# Patient Record
Sex: Female | Born: 1965 | Race: White | Hispanic: No | Marital: Married | Smoking: Former smoker
Health system: Southern US, Community
[De-identification: ages and names within clinical notes are randomized; demographics above are authoritative.]

## PROBLEM LIST (undated history)

## (undated) DIAGNOSIS — R51 Headache: Secondary | ICD-10-CM

## (undated) DIAGNOSIS — J45909 Unspecified asthma, uncomplicated: Secondary | ICD-10-CM

## (undated) DIAGNOSIS — D219 Benign neoplasm of connective and other soft tissue, unspecified: Secondary | ICD-10-CM

## (undated) DIAGNOSIS — T7840XA Allergy, unspecified, initial encounter: Secondary | ICD-10-CM

## (undated) HISTORY — PX: UTERINE FIBROID SURGERY: SHX826

## (undated) HISTORY — DX: Headache: R51

## (undated) HISTORY — DX: Unspecified asthma, uncomplicated: J45.909

## (undated) HISTORY — DX: Allergy, unspecified, initial encounter: T78.40XA

---

## 2002-08-15 ENCOUNTER — Encounter: Admission: RE | Admit: 2002-08-15 | Discharge: 2002-08-15 | Payer: Self-pay | Admitting: Internal Medicine

## 2002-08-15 ENCOUNTER — Encounter: Payer: Self-pay | Admitting: Internal Medicine

## 2011-09-04 ENCOUNTER — Telehealth: Payer: Self-pay

## 2011-09-04 MED ORDER — GUAIFENESIN ER 1200 MG PO TB12: 1.0000 | ORAL_TABLET | Freq: Two times a day (BID) | ORAL | Status: DC | PRN

## 2011-09-04 MED ORDER — IPRATROPIUM BROMIDE 0.03 % NA SOLN: 2.0000 | Freq: Two times a day (BID) | NASAL | Status: DC

## 2011-09-04 MED ORDER — AMOXICILLIN 875 MG PO TABS: 1750.0000 mg | ORAL_TABLET | Freq: Two times a day (BID) | ORAL | Status: DC

## 2011-09-04 NOTE — Telephone Encounter (Signed)
Spoke with pt, she is having pain above her sinuses and eyes since yesterday evening. No cough, no drainage, no dysuria, no diarrhea. Pt states she is coming back on Monday and cannot see how she is going to fly with this pain. She can breathe fine but it is so painful. She would like whatever she had last time she was here for sinus infection, it worked well. The pt was very nice on the phone and understanding.

## 2011-09-04 NOTE — Telephone Encounter (Signed)
Already addressed

## 2011-09-04 NOTE — Telephone Encounter (Signed)
I have reviewed her paper chart.  The last treatment for a sinusitis was in 2009.  Erx'd meds to the [facility] in [place], [place]  Please verify they received them.

## 2011-09-04 NOTE — Telephone Encounter (Signed)
Please call patient and get info/details.  We have not seen the patient in at least 6 months, and we do not call in abx for infections we have not evaluated.  Patient's husband has called and been rude to [place].

## 2011-09-04 NOTE — Telephone Encounter (Signed)
MARTIN STATES HE AND Taormina ARE IN [place] AND SHE HAVE A SINUS INFECTION. WOULD LIKE [place] TO CALL IN A  Z-PAK FOR HER AND THEY WILL BE IN SOMETIME NEXT WEEK FOR A VISIT PLEASE CALL [phone]. [facility] IN [place] AND THE FAX IS [phone], HE DIDN'T KNOW THE PHONE NUMBER   [facility] AT [phone]

## 2011-09-09 ENCOUNTER — Ambulatory Visit (INDEPENDENT_AMBULATORY_CARE_PROVIDER_SITE_OTHER): Payer: PRIVATE HEALTH INSURANCE | Admitting: Family Medicine

## 2011-09-09 VITALS — BP 114/67 | HR 60 | Temp 97.4°F | Resp 16 | Ht 64.0 in | Wt 142.0 lb

## 2011-09-09 DIAGNOSIS — J329 Chronic sinusitis, unspecified: Secondary | ICD-10-CM

## 2011-09-09 DIAGNOSIS — G501 Atypical facial pain: Secondary | ICD-10-CM

## 2011-09-09 MED ORDER — AMOXICILLIN 875 MG PO TABS: 875.0000 mg | ORAL_TABLET | Freq: Two times a day (BID) | ORAL | Status: AC

## 2011-09-09 NOTE — Progress Notes (Signed)
was in [place] and called 09/04/11 and we called in Amox for sinus infection. Was told to follow-up once back and still has slight HA, sinus pressure and pain, ands nasal congestion.  Pain is now much milder and improving, but patient has run out of her medication  O:  NAD HEENT:  Unremarkable Neck:  Supple, no adenopathy Fundi:  Normal Neuro:  Normal mental status, cranial nerves, motor  A:  Sinusitis, improving  1. Sinusitis  amoxicillin (AMOXIL) 875 MG tablet

## 2011-11-11 ENCOUNTER — Ambulatory Visit (INDEPENDENT_AMBULATORY_CARE_PROVIDER_SITE_OTHER): Payer: PRIVATE HEALTH INSURANCE | Admitting: Emergency Medicine

## 2011-11-11 VITALS — BP 112/72 | HR 60 | Temp 98.6°F | Resp 16 | Ht 65.0 in | Wt 146.0 lb

## 2011-11-11 DIAGNOSIS — H539 Unspecified visual disturbance: Secondary | ICD-10-CM

## 2011-11-11 DIAGNOSIS — R51 Headache: Secondary | ICD-10-CM | POA: Insufficient documentation

## 2011-11-11 DIAGNOSIS — G43909 Migraine, unspecified, not intractable, without status migrainosus: Secondary | ICD-10-CM

## 2011-11-11 DIAGNOSIS — R519 Headache, unspecified: Secondary | ICD-10-CM | POA: Insufficient documentation

## 2011-11-11 DIAGNOSIS — J309 Allergic rhinitis, unspecified: Secondary | ICD-10-CM | POA: Insufficient documentation

## 2011-11-11 DIAGNOSIS — J45909 Unspecified asthma, uncomplicated: Secondary | ICD-10-CM | POA: Insufficient documentation

## 2011-11-11 DIAGNOSIS — R42 Dizziness and giddiness: Secondary | ICD-10-CM

## 2011-11-11 LAB — POCT URINALYSIS DIPSTICK
Glucose, UA: NEGATIVE
Spec Grav, UA: 1.01
Urobilinogen, UA: 0.2

## 2011-11-11 LAB — POCT CBC
Granulocyte percent: 58 %G (ref 37–80)
HCT, POC: 46.1 % (ref 37.7–47.9)
Lymph, poc: 2.9 (ref 0.6–3.4)
MCHC: 31.2 g/dL — AB (ref 31.8–35.4)
MID (cbc): 0.5 (ref 0–0.9)
MPV: 10.1 fL (ref 0–99.8)
POC Granulocyte: 4.8 (ref 2–6.9)
POC LYMPH PERCENT: 35.5 %L (ref 10–50)
POC MID %: 6.5 %M (ref 0–12)
Platelet Count, POC: 345 10*3/uL (ref 142–424)
RDW, POC: 12 %

## 2011-11-11 LAB — POCT UA - MICROSCOPIC ONLY: Mucus, UA: NEGATIVE

## 2011-11-11 LAB — GLUCOSE, POCT (MANUAL RESULT ENTRY): POC Glucose: 80 mg/dl (ref 70–99)

## 2011-11-11 NOTE — Progress Notes (Signed)
Subjective:    Patient ID: Erica Richards, female    DOB: [birth year], 46 y.o.   MRN: [id number]  HPI This 46 y.o. female presents for evaluation of visual disturbance.  Symptoms began in July, when she returned from [place]  Reports episodes of seeing blue spots, unable to see anything else, for several moments (feels like minutes) about 3 times each week.  Has to close her eyes and wait for it to pass.  Most noticeable when looking at her computer screen or TV, but can happen any time.  She also notes intense dizziness prior to her trip to [place]  No nausea, no spinning "visual" but has a spinning sensation; feels like a "weight."  Not related to movement, and typically occurs when she is sitting or lying still.  Has had some vertiginous symptoms with rapid position changes, but completely different and separate from this dizziness.  Thinks her vision is worse than before, but has longstanding poor vision with astigmatism.  Her last visit with her eye specialist suggested she may need to add reading glasses to her corrective contact lenses (06/2011).  Mother had a detached retina. No change in hearing. She does have a history of Migraine HA, but they are very infrequent.  Her current symptoms are NOT associated with HA.   Review of Systems    Past Medical History  Diagnosis Date  . Asthma   . Allergy   . Headache     History reviewed. No pertinent past surgical history.  Prior to Admission medications   Medication Sig Start Date End Date Taking? Authorizing Provider  montelukast (SINGULAIR) 10 MG tablet Take 10 mg by mouth at bedtime.   Yes Historical Provider, MD  norethindrone-ethinyl estradiol 1/35 (ORTHO-NOVUM, NORTREL,CYCLAFEM) tablet Take 1 tablet by mouth daily.   Yes Historical Provider, MD    Allergies  Allergen Reactions  . Sulfa Antibiotics Rash    History   Social History  . Marital Status: Married    Spouse Name: Daphine Deutscher    Number of Children: 2  .  Years of Education: 16   Occupational History  . 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 Smoking status: Former Smoker    Types: Cigarettes    Quit date: 11/11/1994  . Smokeless tobacco: Never Used  . Alcohol Use: No  . Drug Use: No  . Sexually Active: Yes -- Female partner(s)    Birth Control/ Protection: Pill   Other Topics Concern  . Not on file   Social History Narrative   Lives with her husband. Older daughter is a full-time Consulting civil engineer at [employer], scheduled to graduate in 01/2012. Younger daughter lives with them part-time, and part-time with her father.    History reviewed. No pertinent family history.     Objective:   Physical Exam Blood pressure 112/72, pulse 60, temperature 98.6 F (37 C), temperature source Oral, resp. rate 16, height 5\' 5"  (1.651 m), weight 146 lb (66.225 kg), last menstrual period 11/11/2011, SpO2 100.00%. Body mass index is 24.30 kg/(m^2). Well-developed, well nourished WF who is awake, alert and oriented, in NAD. HEENT: [place]/AT, PERRL, EOMI.  Sclera and conjunctiva are clear.  Fundi are normal appearing. EAC are patent, TMs are normal in appearance. Nasal mucosa is pink and moist. OP is clear. Neck: supple, non-tender, no lymphadenopathy, thyromegaly. Heart: RRR, no murmur Lungs: normal effort, CTA Abdomen: normo-active bowel sounds, supple, non-tender, no mass or organomegaly. Extremities:  no cyanosis, clubbing or edema. Skin: warm and dry without rash. Psychologic: good mood and appropriate affect, normal speech and behavior. Neurologic:  CNII-XII are intact.  No nystagmus, though lateral gaze to the right initially caused vertigo, but not on repeat testing. Normal sensation and strength.  Normal DTRs.  Cerebellar function intact.   Results for orders placed in visit on 11/11/11  POCT CBC      Component Value Range   WBC 8.3  4.6 - 10.2 K/uL   Lymph, poc 2.9  0.6 - 3.4   POC LYMPH PERCENT 35.5  10  - 50 %L   MID (cbc) 0.5  0 - 0.9   POC MID % 6.5  0 - 12 %M   POC Granulocyte 4.8  2 - 6.9   Granulocyte percent 58.0  37 - 80 %G   RBC 4.56  4.04 - 5.48 M/uL   Hemoglobin 14.4  12.2 - 16.2 g/dL   HCT, POC 11.9  14.7 - 47.9 %   MCV 101.0 (*) 80 - 97 fL   MCH, POC 31.6 (*) 27 - 31.2 pg   MCHC 31.2 (*) 31.8 - 35.4 g/dL   RDW, POC 82.9     Platelet Count, POC 345  142 - 424 K/uL   MPV 10.1  0 - 99.8 fL  GLUCOSE, POCT (MANUAL RESULT ENTRY)      Component Value Range   POC Glucose 80  70 - 99 mg/dl  POCT URINE PREGNANCY      Component Value Range   Preg Test, Ur Negative    POCT UA - MICROSCOPIC ONLY      Component Value Range   WBC, Ur, HPF, POC 0-1     RBC, urine, microscopic 11-18  Currently menstruating.   Bacteria, U Microscopic 2+     Mucus, UA negative     Epithelial cells, urine per micros 1-7     Crystals, Ur, HPF, POC negative     Casts, Ur, LPF, POC negative     Yeast, UA negative    POCT URINALYSIS DIPSTICK      Component Value Range   Color, UA yellow     Clarity, UA slightly cloudy     Glucose, UA negative     Bilirubin, UA negative     Ketones, UA negative     Spec Grav, UA 1.010     Blood, UA large     pH, UA 5.5     Protein, UA negative     Urobilinogen, UA 0.2     Nitrite, UA negative     Leukocytes, UA Negative         Assessment & Plan:   1. Dizziness  POCT CBC, POCT glucose (manual entry), POCT urine pregnancy, POCT UA - Microscopic Only, POCT urinalysis dipstick, EKG 12-Lead, Comprehensive metabolic panel, TSH, MR Brain W Wo Contrast  2. AR (allergic rhinitis)    3. Asthma    4. Visual disturbance  MR Brain W Wo Contrast  5. Migraine  MR Brain W Wo Contrast   Declines Antivert, stating that the symptoms resolve spontaneously quickly enough that she doesn't think medication would benefit her.  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 Discussed with Dr. Cleta Alberts.

## 2011-11-11 NOTE — Patient Instructions (Signed)
Schedule with your eye specialist soon.  If everything is normal, including on the MRI, we'll refer you to neurologist if your symptoms persist.

## 2011-11-12 LAB — COMPREHENSIVE METABOLIC PANEL
Albumin: 4.4 g/dL (ref 3.5–5.2)
Alkaline Phosphatase: 44 U/L (ref 39–117)
BUN: 13 mg/dL (ref 6–23)
Calcium: 9.8 mg/dL (ref 8.4–10.5)
Chloride: 103 mEq/L (ref 96–112)
Glucose, Bld: 81 mg/dL (ref 70–99)
Potassium: 4.4 mEq/L (ref 3.5–5.3)

## 2011-11-13 ENCOUNTER — Encounter: Payer: Self-pay | Admitting: Physician Assistant

## 2011-11-18 ENCOUNTER — Ambulatory Visit
Admission: RE | Admit: 2011-11-18 | Discharge: 2011-11-18 | Disposition: A | Discharge status: Home / Self Care | Payer: No Typology Code available for payment source | Source: Ambulatory Visit | Attending: Physician Assistant | Admitting: Physician Assistant

## 2011-11-18 ENCOUNTER — Other Ambulatory Visit: Payer: Self-pay | Admitting: Physician Assistant

## 2011-11-18 DIAGNOSIS — H539 Unspecified visual disturbance: Secondary | ICD-10-CM

## 2011-11-18 DIAGNOSIS — G43909 Migraine, unspecified, not intractable, without status migrainosus: Secondary | ICD-10-CM

## 2011-11-18 DIAGNOSIS — R42 Dizziness and giddiness: Secondary | ICD-10-CM

## 2011-11-18 DIAGNOSIS — G935 Compression of brain: Secondary | ICD-10-CM

## 2011-11-18 MED ORDER — GADOBENATE DIMEGLUMINE 529 MG/ML IV SOLN
13.0000 mL | Freq: Once | INTRAVENOUS | Status: AC | PRN
  Administered 2011-11-18: 13 mL via INTRAVENOUS

## 2011-11-30 ENCOUNTER — Telehealth: Payer: Self-pay

## 2011-11-30 NOTE — Telephone Encounter (Signed)
I have called patient, she has seen a doctor at [facility]. She indicates he wants to do other testing, she does not know what they have recommended, she will call them and have them send notes to my attention.

## 2011-11-30 NOTE — Telephone Encounter (Signed)
chelle  Patient would like to talk to you regarding the referral that she had please call [phone]

## 2011-12-04 ENCOUNTER — Ambulatory Visit (INDEPENDENT_AMBULATORY_CARE_PROVIDER_SITE_OTHER): Payer: PRIVATE HEALTH INSURANCE | Admitting: Family Medicine

## 2011-12-04 VITALS — BP 114/64 | HR 75 | Temp 98.4°F | Resp 16 | Ht 64.5 in | Wt 146.0 lb

## 2011-12-04 DIAGNOSIS — H539 Unspecified visual disturbance: Secondary | ICD-10-CM

## 2011-12-04 DIAGNOSIS — R51 Headache: Secondary | ICD-10-CM

## 2011-12-04 DIAGNOSIS — G935 Compression of brain: Secondary | ICD-10-CM | POA: Insufficient documentation

## 2011-12-04 DIAGNOSIS — R42 Dizziness and giddiness: Secondary | ICD-10-CM

## 2011-12-04 DIAGNOSIS — L659 Nonscarring hair loss, unspecified: Secondary | ICD-10-CM

## 2011-12-04 LAB — PROLACTIN: Prolactin: 6.1 ng/mL

## 2011-12-04 NOTE — Progress Notes (Signed)
Subjective:    Patient ID: Erica Richards, female    DOB: 1965-11-15, 46 y.o.   MRN: [id number]  HPI This 46 y.o. female presents for evaluation of headache, visual disturbance and debilitating vertigo/dizziness.  She has had no HA or vertigo, but has had the visual disturbance (flashing lights) since her last visit here 11/11/2011.  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  She was evaluated by Dr. Wynetta Emery at [facility] and his note from 11/24/2011 is reviewed.  He does  Not think that her symptoms are related to the Chiari malformation, and advised her to 1) see her PCP for hormone testing and 2) see Dr. Vela Prose for evaluation of the HA.  At her last visit here, we checked a TSH, which was normal.  Since she is on a combined oral contraceptive, FSH and LH aren't going to be helpful in this situation.  Today she notes that she had her last HA/vertigo episode just before the onset of her menstrual bleeding.  Since she anticipates her period next week, she wonders if the symptoms will recur.  She also has noticed significant thinning of her hair, which is very alarming to her.  No increased fatigue, skin changes, constipation, hot flashes.  Review of Systems As above.   Past Medical History  Diagnosis Date  . Asthma   . Allergy   . Headache     History reviewed. No pertinent past surgical history.  Prior to Admission medications   Medication Sig Start Date End Date Taking? Authorizing Provider  montelukast (SINGULAIR) 10 MG tablet Take 10 mg by mouth at bedtime.   Yes Historical Provider, MD  norethindrone-ethinyl estradiol 1/35 (ORTHO-NOVUM, NORTREL,CYCLAFEM) tablet Take 1 tablet by mouth daily.   Yes Historical Provider, MD    Allergies  Allergen Reactions  . Sulfa Antibiotics Rash    History   Social History  . Marital Status: Married    Spouse Name: Daphine Deutscher    Number of  Children: 2  . Years of Education: 16   Occupational History  . 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 Smoking status: Former Smoker    Types: Cigarettes    Quit date: 11/11/1994  . Smokeless tobacco: Never Used  . Alcohol Use: No  . Drug Use: No  . Sexually Active: Yes -- Female partner(s)    Birth Control/ Protection: Pill   Other Topics Concern  . Not on file   Social History Narrative   Lives with her husband. Older daughter is a full-time Consulting civil engineer at [employer], scheduled to graduate in 01/2012. Younger daughter lives with them part-time, and part-time with her father.    History reviewed. No pertinent family history.     Objective:   Physical Exam Blood pressure 114/64, pulse 75, temperature 98.4 F (36.9 C), temperature source Oral, resp. rate 16, height 5' 4.5" (1.638 m), weight 146 lb (66.225 kg), last menstrual period 11/11/2011, SpO2 99.00%. Body mass index is 24.67 kg/(m^2). Well-developed, well nourished WF who is awake, alert and oriented, in NAD. HEENT: [place]/AT, sclera and conjunctiva are clear.  Neck: supple, non-tender, no lymphadenopathy, thyromegaly. Heart: RRR, no murmur Lungs: normal effort, CTA Psychologic: good mood and appropriate affect, normal speech and behavior.     Assessment & Plan:   1. Dizziness  Prolactin, Ambulatory referral to Neurology  2. Visual disturbance  Prolactin, Ambulatory referral to Neurology  3. HA (headache)  Prolactin, Ambulatory referral to Neurology  4. Hair loss  Prolactin   Proceed with neurology evaluation.  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  I suspect her thinning hair is temporary, but am happy to refer to endocrinology if it persists.  If necessary, she could stop her COC temporarily so FSH and LH can be checked, but she will need another very effective contraceptive in it's place.  Discussed with Dr. Katrinka Blazing.

## 2012-01-20 NOTE — Progress Notes (Signed)
HPI and physical exam reviewed in detail. Discussed patient in detail with Porfirio Oar, PA-C.  Agree with current A/P.  Proceed with neurological evaluation first; if evaluation unrevealing, consider referral to endocrinology.  KMS

## 2012-01-25 NOTE — Progress Notes (Signed)
Reviewed and agree.

## 2012-07-11 ENCOUNTER — Ambulatory Visit (INDEPENDENT_AMBULATORY_CARE_PROVIDER_SITE_OTHER): Payer: PRIVATE HEALTH INSURANCE | Admitting: Emergency Medicine

## 2012-07-11 VITALS — BP 122/78 | HR 86 | Temp 98.0°F | Resp 16 | Ht 64.5 in | Wt 143.0 lb

## 2012-07-11 DIAGNOSIS — K6289 Other specified diseases of anus and rectum: Secondary | ICD-10-CM

## 2012-07-11 DIAGNOSIS — K645 Perianal venous thrombosis: Secondary | ICD-10-CM

## 2012-07-11 MED ORDER — DOCUSATE SODIUM 100 MG PO CAPS: 100.0000 mg | ORAL_CAPSULE | Freq: Two times a day (BID) | ORAL | Status: DC

## 2012-07-11 MED ORDER — HYDROCODONE-ACETAMINOPHEN 5-325 MG PO TABS: 1.0000 | ORAL_TABLET | ORAL | Status: DC | PRN

## 2012-07-11 NOTE — Progress Notes (Signed)
Procedure Note: Verbal consent obtained from the patient.  Local anesthesia with 2cc 1% lidocaine with epinephrine.  Betadine prep.  Elliptical incision with 15 blade.  Clot expressed from the hemorrhoid.  Hemostasis easily obtained with direct pressure.  Dressing applied.  Pt tolerated very well.

## 2012-07-11 NOTE — Progress Notes (Signed)
[facility] [address] [place] [address] [phone]  Date:  07/11/2012   Name:  Erica Richards   DOB:  1965/10/29   MRN:  [id number]  PCP:  No primary provider on file.    Chief Complaint: Hemorrhoids   History of Present Illness:  Erica Richards is a 47 y.o. very pleasant female patient who presents with the following:  History of prior thrombosed external hemorrhoids.  Now has left thrombosed hemorrhoid for past few days.  No constipation or bleeding.  Denies other complaint or health concern today.   Patient Active Problem List   Diagnosis Date Noted  . Dizziness 12/04/2011  . Visual disturbance 12/04/2011  . Chiari malformation type I 12/04/2011  . AR (allergic rhinitis) 11/11/2011  . Asthma 11/11/2011  . Headache     Past Medical History  Diagnosis Date  . Asthma   . Allergy   . Headache(784.0)     History reviewed. No pertinent past surgical history.  History  Substance Use Topics  . Smoking status: Former Smoker    Types: Cigarettes    Quit date: 11/11/1994  . Smokeless tobacco: Never Used  . Alcohol Use: No    History reviewed. No pertinent family history.  Allergies  Allergen Reactions  . Sulfa Antibiotics Rash    Medication list has been reviewed and updated.  Current Outpatient Prescriptions on File Prior to Visit  Medication Sig Dispense Refill  . montelukast (SINGULAIR) 10 MG tablet Take 10 mg by mouth at bedtime.      . norethindrone-ethinyl estradiol 1/35 (ORTHO-NOVUM, NORTREL,CYCLAFEM) tablet Take 1 tablet by mouth daily.       No current facility-administered medications on file prior to visit.    Review of Systems:  As per HPI, otherwise negative.    Physical Examination: Filed Vitals:   07/11/12 1442  BP: 122/78  Pulse: 86  Temp: 98 F (36.7 C)  Resp: 16   Filed Vitals:   07/11/12 1442  Height: 5' 4.5" (1.638 m)  Weight: 143 lb (64.864 kg)   Body mass index is 24.18 kg/(m^2). Ideal Body  Weight: Weight in (lb) to have BMI = 25: 147.6   GEN: WDWN, NAD, Non-toxic, Alert & Oriented x 3 HEENT: Atraumatic, Normocephalic.  Ears and Nose: No external deformity. EXTR: No clubbing/cyanosis/edema NEURO: Normal gait.  PSYCH: Normally interactive. Conversant. Not depressed or anxious appearing.  Calm demeanor.  RECTAL:  Thrombosed external hemorrhoid.  Assessment and Plan: Thrombosed external hemorrhoid Colace vicodin Sitz Follow up in one week   Signed,  Phillips Odor, MD

## 2012-07-11 NOTE — Patient Instructions (Addendum)
Hemorrhoids Hemorrhoids are swollen veins around the rectum or anus. There are two types of hemorrhoids:   Internal hemorrhoids. These occur in the veins just inside the rectum. They may poke through to the outside and become irritated and painful.  External hemorrhoids. These occur in the veins outside the anus and can be felt as a painful swelling or hard lump near the anus. CAUSES  Pregnancy.   Obesity.   Constipation or diarrhea.   Straining to have a bowel movement.   Sitting for long periods on the toilet.  Heavy lifting or other activity that caused you to strain.  Anal intercourse. SYMPTOMS   Pain.   Anal itching or irritation.   Rectal bleeding.   Fecal leakage.   Anal swelling.   One or more lumps around the anus.  DIAGNOSIS  Your caregiver may be able to diagnose hemorrhoids by visual examination. Other examinations or tests that may be performed include:   Examination of the rectal area with a gloved hand (digital rectal exam).   Examination of anal canal using a small tube (scope).   A blood test if you have lost a significant amount of blood.  A test to look inside the colon (sigmoidoscopy or colonoscopy). TREATMENT Most hemorrhoids can be treated at home. However, if symptoms do not seem to be getting better or if you have a lot of rectal bleeding, your caregiver may perform a procedure to help make the hemorrhoids get smaller or remove them completely. Possible treatments include:   Placing a rubber band at the base of the hemorrhoid to cut off the circulation (rubber band ligation).   Injecting a chemical to shrink the hemorrhoid (sclerotherapy).   Using a tool to burn the hemorrhoid (infrared light therapy).   Surgically removing the hemorrhoid (hemorrhoidectomy).   Stapling the hemorrhoid to block blood flow to the tissue (hemorrhoid stapling).  HOME CARE INSTRUCTIONS   Eat foods with fiber, such as whole grains, beans,  nuts, fruits, and vegetables. Ask your doctor about taking products with added fiber in them (fibersupplements).  Increase fluid intake. Drink enough water and fluids to keep your urine clear or pale yellow.   Exercise regularly.   Go to the bathroom when you have the urge to have a bowel movement. Do not wait.   Avoid straining to have bowel movements.   Keep the anal area dry and clean. Use wet toilet paper or moist towelettes after a bowel movement.   Medicated creams and suppositories may be used or applied as directed.   Only take over-the-counter or prescription medicines as directed by your caregiver.   Take warm sitz baths for 15 20 minutes, 3 4 times a day to ease pain and discomfort.   Place ice packs on the hemorrhoids if they are tender and swollen. Using ice packs between sitz baths may be helpful.   Put ice in a plastic bag.   Place a towel between your skin and the bag.   Leave the ice on for 15 20 minutes, 3 4 times a day.   Do not use a donut-shaped pillow or sit on the toilet for long periods. This increases blood pooling and pain.  SEEK MEDICAL CARE IF:  You have increasing pain and swelling that is not controlled by treatment or medicine.  You have uncontrolled bleeding.  You have difficulty or you are unable to have a bowel movement.  You have pain or inflammation outside the area of the hemorrhoids. MAKE SURE YOU:    Understand these instructions.  Will watch your condition.  Will get help right away if you are not doing well or get worse. Document Released: 01/24/2000 Document Revised: 01/13/2012 Document Reviewed: 12/01/2011 Exit[employer] Patient Information 2014 [employer].  

## 2012-09-01 ENCOUNTER — Telehealth: Payer: Self-pay

## 2012-09-01 NOTE — Telephone Encounter (Signed)
Erica Richards or first available: Patient calling about her hemorrhoids. Please call her asap.  [phone]

## 2012-09-01 NOTE — Telephone Encounter (Signed)
Rtn pt call- She has been experiencing hemorrhoids. She states it is "tortured" she states it has been there for 3 weeks in total. She is worried that she needs to have surgery. Advised her to come back in. She states that she was here early July and had one drained. She states this is in a different spot. She wants to continue OTC remedies and if it gets worse by tomorrow she will come in.

## 2012-09-02 ENCOUNTER — Ambulatory Visit (INDEPENDENT_AMBULATORY_CARE_PROVIDER_SITE_OTHER): Payer: PRIVATE HEALTH INSURANCE | Admitting: Physician Assistant

## 2012-09-02 VITALS — BP 108/70 | HR 62 | Temp 98.2°F | Resp 16

## 2012-09-02 DIAGNOSIS — B001 Herpesviral vesicular dermatitis: Secondary | ICD-10-CM | POA: Insufficient documentation

## 2012-09-02 DIAGNOSIS — B009 Herpesviral infection, unspecified: Secondary | ICD-10-CM

## 2012-09-02 DIAGNOSIS — K645 Perianal venous thrombosis: Secondary | ICD-10-CM

## 2012-09-02 MED ORDER — HYDROCORTISONE ACETATE 25 MG RE SUPP: 25.0000 mg | Freq: Two times a day (BID) | RECTAL | Status: DC

## 2012-09-02 MED ORDER — VALACYCLOVIR HCL 1 G PO TABS: ORAL_TABLET | ORAL | Status: DC

## 2012-09-02 NOTE — Progress Notes (Signed)
   [address] [place] [address]   Phone [phone]  Subjective:    Patient ID: Erica Richards, female    DOB: [birth year], 47 y.o.   MRN: [id number]  HPI  Pt presents to clinic with a thrombosed hemorrhoid.  She had one in June but it was in a different location and it completed resolved after the procedure.  She has chronic problems with constipation but does not feel like she strains.  She has used steroid creams before which have helped.  She also has fever blisters and has been having more of them recently.  She has used Zovirax which helped and would like a Rx if possible.  Review of Systems     Objective:   Physical Exam  Vitals reviewed. Constitutional: She is oriented to person, place, and time. She appears well-developed and well-nourished.  Eyes: Conjunctivae are normal.  Neck: Normal range of motion.  Genitourinary: Rectal exam shows external hemorrhoid (thrombosed on the L side).  Neurological: She is alert and oriented to person, place, and time.  Skin: Skin is warm and dry.  Psychiatric: She has a normal mood and affect. Her behavior is normal. Judgment and thought content normal.   Procedure:  Consent obtained.  Local anesthesia with 2% lido with epi.  Area cleaned and iris scissors used to make an elliptical incision in the thrombosed hemorrhoid - multiple clots were removed.  Wound care d/w pt.    Assessment & Plan:  Thrombosed external hemorrhoid - Plan: hydrocortisone (ANUSOL-HC) 25 MG suppository to be used when she gets a hemorrhoid to decrease chances of thrombosed hemorrhoids in the future.  Herpes labialis- valACYclovir (VALTREX) 1000 MG tablet  Benny Lennert [facility] 09/02/2012 8:48 PM

## 2013-03-23 ENCOUNTER — Other Ambulatory Visit: Payer: Self-pay | Admitting: Physician Assistant

## 2013-06-30 ENCOUNTER — Ambulatory Visit (INDEPENDENT_AMBULATORY_CARE_PROVIDER_SITE_OTHER): Payer: PRIVATE HEALTH INSURANCE | Admitting: Family Medicine

## 2013-06-30 VITALS — BP 112/70 | HR 58 | Temp 98.1°F | Resp 16 | Ht 64.0 in | Wt 148.6 lb

## 2013-06-30 DIAGNOSIS — H8110 Benign paroxysmal vertigo, unspecified ear: Secondary | ICD-10-CM

## 2013-06-30 MED ORDER — MECLIZINE HCL 12.5 MG PO TABS: 12.5000 mg | ORAL_TABLET | Freq: Three times a day (TID) | ORAL | Status: DC | PRN

## 2013-06-30 NOTE — Progress Notes (Signed)
°  This chart was scribed for Erica Hamman, MD by Eston Mould, ED Scribe. This patient was seen in room Room/bed 13 and the patient's care was started at 4:54 PM. Subjective:    Patient ID: Erica Richards, female    DOB: Jul 12, 1965, 48 y.o.   MRN: [id number] Chief Complaint  Patient presents with   Otalgia    x 1 week ago   HPI Erica Richards is a 48 y.o. female who presents to the [facility] complaining of ongoing otalgia and dizziness that began 1 week ago. Pt states she felt dizzy 3 days ago and last night, turning to the R precipitates the dizziness. States her dizziness begins when she is lying down and subsides when she is upright. Denies recent fevers.  Work: Psychologist, forensic  Review of Systems  Constitutional: Negative for fever.  HENT: Positive for ear pain. Negative for ear discharge and facial swelling.   All other systems reviewed and are negative.  Objective:   Physical Exam  Nursing note and vitals reviewed. Constitutional: She is oriented to person, place, and time. She appears well-developed and well-nourished. No distress.  When lying supine and turning head to the R, patient experiences sudden violent dizziness.  HENT:  Head: Normocephalic and atraumatic.  Right Ear: External ear normal.  Left Ear: External ear normal.  Eyes: EOM are normal.  Neck: Neck supple. No tracheal deviation present.  Cardiovascular: Normal rate.   Pulmonary/Chest: Effort normal. No respiratory distress.  Musculoskeletal: Normal range of motion.  Neurological: She is alert and oriented to person, place, and time.  Skin: Skin is warm and dry.  Psychiatric: She has a normal mood and affect. Her behavior is normal.   Triage Vitals:BP 112/70   Pulse 58   Temp(Src) 98.1 F (36.7 C) (Oral)   Resp 16   Ht 5\' 4"  (1.626 m)   Wt 148 lb 9.6 oz (67.405 kg)   BMI 25.49 kg/m2   SpO2 100% Assessment & Plan:  Will discharge pt with medication. Benign paroxysmal positional  vertigo - Plan: meclizine (ANTIVERT) 12.5 MG tablet  Signed, Robyn Haber, MD

## 2013-06-30 NOTE — Patient Instructions (Signed)

## 2013-10-03 ENCOUNTER — Ambulatory Visit (INDEPENDENT_AMBULATORY_CARE_PROVIDER_SITE_OTHER): Payer: PRIVATE HEALTH INSURANCE | Admitting: Physician Assistant

## 2013-10-03 VITALS — BP 118/68 | HR 75 | Temp 97.5°F | Resp 18 | Ht 65.0 in | Wt 149.0 lb

## 2013-10-03 DIAGNOSIS — J0110 Acute frontal sinusitis, unspecified: Secondary | ICD-10-CM

## 2013-10-03 DIAGNOSIS — J029 Acute pharyngitis, unspecified: Secondary | ICD-10-CM

## 2013-10-03 DIAGNOSIS — J3489 Other specified disorders of nose and nasal sinuses: Secondary | ICD-10-CM

## 2013-10-03 DIAGNOSIS — R0981 Nasal congestion: Secondary | ICD-10-CM

## 2013-10-03 MED ORDER — AMOXICILLIN-POT CLAVULANATE 875-125 MG PO TABS: 1.0000 | ORAL_TABLET | Freq: Two times a day (BID) | ORAL | Status: DC

## 2013-10-03 NOTE — Progress Notes (Signed)
   Subjective:    Patient ID: Erica Richards, female    DOB: 09-09-1965, 48 y.o.   MRN: [id number]  HPI 48 year old female presents for evaluation of 1 week history of sinus congestion, PND, sinus headache, PND, and sore throat. Admits she noticed some wheezing and SOB yesterday, but denies chest pain, dizziness, hemoptysis, or cough. She has been taking OTC tylenol and sudafed which does seem to help some, but symptoms always return. Had 3 days of a sinus headache that has now resolved, although facial pain continues. Hx of sinus infections in the past, usually 2x/year.  Patient is otherwise doing well with no other concerns today.      Review of Systems  Constitutional: Negative for fever and chills.  HENT: Positive for congestion, postnasal drip, sinus pressure and sore throat. Negative for ear pain.   Respiratory: Positive for shortness of breath and wheezing. Negative for cough.   Gastrointestinal: Negative for nausea and vomiting.  Neurological: Positive for headaches. Negative for dizziness.       Objective:   Physical Exam  Constitutional: She is oriented to person, place, and time. She appears well-developed and well-nourished.  HENT:  Head: Normocephalic and atraumatic.  Right Ear: Hearing, tympanic membrane, external ear and ear canal normal.  Left Ear: Hearing, tympanic membrane, external ear and ear canal normal.  Nose: Right sinus exhibits frontal sinus tenderness. Right sinus exhibits no maxillary sinus tenderness. Left sinus exhibits frontal sinus tenderness. Left sinus exhibits no maxillary sinus tenderness.  Mouth/Throat: Uvula is midline, oropharynx is clear and moist and mucous membranes are normal.  Eyes: Conjunctivae are normal.  Neck: Normal range of motion.  Cardiovascular: Normal rate.   Pulmonary/Chest: Effort normal.  Lymphadenopathy:    She has no cervical adenopathy.  Neurological: She is alert and oriented to person, place, and time.  Psychiatric: She  has a normal mood and affect. Her behavior is normal. Judgment and thought content normal.          Assessment & Plan:  Acute frontal sinusitis, recurrence not specified - Plan: amoxicillin-clavulanate (AUGMENTIN) 875-125 MG per tablet, DISCONTINUED: amoxicillin-clavulanate (AUGMENTIN) 875-125 MG per tablet  Acute pharyngitis, unspecified pharyngitis type  Nasal congestion  Patient afebrile and lungs are clear. Will treat as acute frontal sinusitis with Augmentin 875 mg bid x 7 days Continue Sudafed as directed if helpful. Patient declined nasal spray due to causing migraine headaches.  RTC precautions discussed - worsening SOB, wheezing, fever, chills, chest pain.  Follow up if symptoms worsen or fail to improve.

## 2013-10-04 ENCOUNTER — Telehealth: Payer: Self-pay

## 2013-10-04 DIAGNOSIS — J0110 Acute frontal sinusitis, unspecified: Secondary | ICD-10-CM

## 2013-10-04 MED ORDER — CEFDINIR 300 MG PO CAPS
600.0000 mg | ORAL_CAPSULE | Freq: Every day | ORAL | Status: AC
Start: 2013-10-04 — End: 2013-10-14

## 2013-10-04 NOTE — Telephone Encounter (Signed)
Rtn call to pt- she has horrible nausea and diarrhea. Pt has stopped taking medication and would like something else called in for her. I have added this to her allergy list as an intolerance. Please advise.

## 2013-10-04 NOTE — Telephone Encounter (Signed)
Pt was seen by Nira Conn yesterday and was prescribed an antibiotic.  She says she needs something else called in because the antibiotic has upset her stomach so bad.  [phone]

## 2013-10-04 NOTE — Telephone Encounter (Signed)
Pt advised.

## 2013-10-04 NOTE — Telephone Encounter (Signed)
Meds ordered this encounter  Medications  . cefdinir (OMNICEF) 300 MG capsule    Sig: Take 2 capsules (600 mg total) by mouth daily.    Dispense:  20 capsule    Refill:  0    Order Specific Question:  Supervising Provider    Answer:  DOOLITTLE, ROBERT P [[id number]]

## 2013-12-28 ENCOUNTER — Ambulatory Visit (INDEPENDENT_AMBULATORY_CARE_PROVIDER_SITE_OTHER): Payer: PRIVATE HEALTH INSURANCE | Admitting: Physician Assistant

## 2013-12-28 VITALS — BP 122/78 | HR 62 | Temp 98.1°F | Resp 18 | Ht 65.0 in | Wt 153.0 lb

## 2013-12-28 DIAGNOSIS — Z889 Allergy status to unspecified drugs, medicaments and biological substances status: Secondary | ICD-10-CM

## 2013-12-28 DIAGNOSIS — Z9109 Other allergy status, other than to drugs and biological substances: Secondary | ICD-10-CM

## 2013-12-28 DIAGNOSIS — J0140 Acute pansinusitis, unspecified: Secondary | ICD-10-CM

## 2013-12-28 DIAGNOSIS — J3489 Other specified disorders of nose and nasal sinuses: Secondary | ICD-10-CM

## 2013-12-28 MED ORDER — AZELASTINE HCL 0.1 % NA SOLN: 2.0000 | Freq: Every day | NASAL | Status: DC

## 2013-12-28 MED ORDER — AMOXICILLIN 875 MG PO TABS: 875.0000 mg | ORAL_TABLET | Freq: Two times a day (BID) | ORAL | Status: DC

## 2013-12-28 MED ORDER — AZELASTINE HCL 0.1 % NA SOLN: 2.0000 | Freq: Two times a day (BID) | NASAL | Status: DC

## 2013-12-28 MED ORDER — CETIRIZINE HCL 10 MG PO TABS: 10.0000 mg | ORAL_TABLET | Freq: Every day | ORAL | Status: DC

## 2013-12-28 NOTE — Progress Notes (Signed)
Subjective:    Patient ID: Erica Richards, female    DOB: 21-Dec-1965, 48 y.o.   MRN: [id number]  Chief Complaint  Patient presents with  . Headache    2 weeks on and off worse the past 3 days     HPI  48 year old female with PMH of asthma, allergy, and headache, is here for sinus pressure and pain that has been intermittent for 2 weeks, but progressively worsened over the last 3 days.  The pain is behind the eyes and feels as if her "eyes are being pushed out".  Her cheeks and teeth hurt.  She has taken chlorotrimeton, aleve, and sudafed for relief, which has helped very little.  She denies sorethroat, dyspnea, photophobia, or ear symptoms.  She denies a fever, but yesterday felt as if she was burning up.  She lives with daughter and husband with 3 dogs and 1 cat, though she has strong allergies.  She takes singulair for her allergies as needed, which she states resolves her allergy symptoms.  She has the cat for her daughter, but states that she does not want it.  The dogs sleep in the bed with her, and the cat lays on her bed, near her pillows.   She states that claritin does not work and zyrtec helped very little.  When asking about antibiotics, she claims that the augmentin felt like pins and needles going down her throat.   She states that most nasal sprays give her migraines including flonase.  She may use the nedipot when she remembers during her nasal congestion/sinus pressure episodes.      Allergies: Feathers, dust, dander, dogs, trees, cats  Past Medical History  Diagnosis Date  . Asthma   . Allergy   . Headache(784.0)    History   Social History  . Marital Status: Married    Spouse Name: Hassell Done    Number of Children: 2  . Years of Education: 16   Occupational History  . 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 Smoking status: Former Smoker    Types: Cigarettes    Quit date: 11/11/1994  . Smokeless tobacco: Never Used  . Alcohol Use: No    . Drug Use: No  . Sexual Activity:    Partners: Male    Birth Control/ Protection: Pill   Other Topics Concern  . None   Social History Narrative   Lives with her husband. Older daughter is a full-time Ship broker at [employer], scheduled to graduate in 01/2012. Younger daughter lives with them part-time, and part-time with her father.   Fam Hx: Daughter has allergies.     Review of Systems  HENT: Positive for congestion. Negative for ear pain, postnasal drip, sinus pressure and sore throat.   Eyes: Positive for pain. Negative for photophobia.  Respiratory: Negative for cough.   Neurological: Positive for headaches.       Objective:   Physical Exam  Constitutional: She is oriented to person, place, and time. Vital signs are normal. She appears well-developed and well-nourished. No distress.  BP 122/78 mmHg  Pulse 62  Temp(Src) 98.1 F (36.7 C) (Oral)  Resp 18  Ht 5\' 5"  (1.651 m)  Wt 153 lb (69.4 kg)  BMI 25.46 kg/m2  SpO2 100%  LMP 12/27/2013   HENT:  Head: Normocephalic and atraumatic.  Nose: Mucosal edema (cyanotic nasal mucosa) present. No rhinorrhea. Right sinus exhibits no maxillary sinus tenderness (No  tenderness with all sinus cavities upon palpation, however felt general soothing with palpative pressure.) and no frontal sinus tenderness. Left sinus exhibits no maxillary sinus tenderness and no frontal sinus tenderness.  Mouth/Throat: Mucous membranes are not cyanotic. No oropharyngeal exudate, posterior oropharyngeal edema or posterior oropharyngeal erythema.  Eyes: Pupils are equal, round, and reactive to light. Right conjunctiva is injected (Mildly injected bilaterally). Right conjunctiva has no hemorrhage. Left conjunctiva is injected. Left conjunctiva has no hemorrhage.  Neck: Normal range of motion. Neck supple. No tracheal tenderness present. No thyroid mass and no thyromegaly present.  Lymphadenopathy:       Head (right side): No submental, no  submandibular, no tonsillar and no occipital adenopathy present.       Head (left side): No submental, no submandibular, no tonsillar and no occipital adenopathy present.    She has no cervical adenopathy.  Neurological: She is alert and oriented to person, place, and time.  Skin: Skin is warm and dry.  Psychiatric: She has a normal mood and affect. Her behavior is normal. Judgment and thought content normal.  Nursing note and vitals reviewed.         Assessment & Plan:  48 year old female is here today with increased sinus pressure and pain worsened 3 days ago.  This appears to be a sinus infection of bacterial etiology, but the true source I'm more suspicious is the lack of sinus allergy maintenance.  Lengthy discussion of avoiding triggers, and not having pets on bed, and on taking a daily medication for her allergies.    Sinus pressure - Plan: cetirizine (ZYRTEC) 10 MG tablet, azelastine (ASTELIN) 0.1 % nasal spray, DISCONTINUED: azelastine (ASTELIN) 0.1 % nasal spray  Multiple allergies - Plan: cetirizine (ZYRTEC) 10 MG tablet, azelastine (ASTELIN) 0.1 % nasal spray, DISCONTINUED: azelastine (ASTELIN) 0.1 % nasal spray  Subacute pansinusitis - Plan: amoxicillin (AMOXIL) 875 MG tablet  Ivar Drape, PA-C [facility] 11/19/20158:25 PM

## 2013-12-28 NOTE — Patient Instructions (Signed)

## 2014-05-24 ENCOUNTER — Other Ambulatory Visit: Payer: Self-pay | Admitting: Obstetrics & Gynecology

## 2014-05-24 DIAGNOSIS — D259 Leiomyoma of uterus, unspecified: Secondary | ICD-10-CM

## 2014-05-29 ENCOUNTER — Ambulatory Visit
Admission: RE | Admit: 2014-05-29 | Discharge: 2014-05-29 | Disposition: A | Discharge status: Home / Self Care | Payer: No Typology Code available for payment source | Source: Ambulatory Visit | Attending: Obstetrics & Gynecology | Admitting: Obstetrics & Gynecology

## 2014-05-29 DIAGNOSIS — D259 Leiomyoma of uterus, unspecified: Secondary | ICD-10-CM

## 2014-05-29 HISTORY — DX: Benign neoplasm of connective and other soft tissue, unspecified: D21.9

## 2014-05-29 NOTE — Consult Note (Signed)
Chief Complaint: Chief Complaint  Patient presents with  . Advice Only    Consult for [place]      Referring Physician(s): Morris,Megan  History of Present Illness: Erica Richards is a 48 y.o. female who was diagnosed with uterine fibroids in February of this year as part of her workup for UTI. In retrospect, she notes that she has had increasingly heavy periods over the past 3 years, with her periods lasting 4 days with 4 days of heavy bleeding, no clots, requiring changing pads every 6-8 hours, as well as some interperiod bleeding. She also describes some pelvic/abdominal bloating symptoms, urinary frequency, and pelvic pressure. Besides birth control pills, she's had no previous fibroid therapies were surgery. Currently gynecologic infections been yeast infection. Pap smear 06/27/2010 was negative. Ultrasound 05/18/2014 demonstrated multiple fibroids up to 4 cm. No plans for future pregnancy. Her mother did not enter menopause until after 8.  Past Medical History  Diagnosis Date  . Asthma   . Allergy   . Headache(784.0)   . Fibroids     No past surgical history on file.  Allergies: Augmentin and Sulfa antibiotics  Medications: Prior to Admission medications   Medication Sig Start Date End Date Taking? Authorizing Provider  cetirizine (ZYRTEC) 10 MG tablet Take 1 tablet (10 mg total) by mouth daily. 12/28/13  Yes Stephanie D English, PA  montelukast (SINGULAIR) 10 MG tablet Take 10 mg by mouth at bedtime.   Yes Historical Provider, MD  norethindrone-ethinyl estradiol 1/35 (ORTHO-NOVUM, NORTREL,CYCLAFEM) tablet Take 1 tablet by mouth daily.   Yes Historical Provider, MD  valACYclovir (VALTREX) 1000 MG tablet TAKE 2 TABLETS AT OUTBREAK ONSET THEN REPEAT IN 12 HOURS FOR EACH OUTBREAK.   Yes Mancel Bale, PA-C  amoxicillin (AMOXIL) 875 MG tablet Take 1 tablet (875 mg total) by mouth 2 (two) times daily. Patient not taking: Reported on 05/29/2014 12/28/13   Dorian Heckle English, PA    azelastine (ASTELIN) 0.1 % nasal spray Place 2 sprays into both nostrils 2 (two) times daily. Use in each nostril as directed Patient not taking: Reported on 05/29/2014 12/28/13   Dorian Heckle English, PA  meclizine (ANTIVERT) 12.5 MG tablet Take 1 tablet (12.5 mg total) by mouth 3 (three) times daily as needed for dizziness. Patient not taking: Reported on 05/29/2014 06/30/13   Robyn Haber, MD    No family history on file.  History   Social History  . Marital Status: Married    Spouse Name: Leelah Hanna Done  . Number of Children: 2  . Years of Education: 16   Occupational History  . 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 Smoking status: Former Smoker    Types: Cigarettes    Quit date: 11/11/1994  . Smokeless tobacco: Never Used  . Alcohol Use: No  . Drug Use: No  . Sexual Activity:    Partners: Male    Birth Control/ Protection: Pill   Other Topics Concern  . Not on file   Social History Narrative   Lives with her husband. Older daughter is a full-time Ship broker at [employer], scheduled to graduate in 01/2012. Younger daughter lives with them part-time, and part-time with her father.    ECOG Status: 1 - Symptomatic but completely ambulatory  Review of Systems: A 12 point ROS discussed and pertinent positives are indicated in the HPI above.  All other systems are negative.  Review of Systems  All other systems  reviewed and are negative.   Vital Signs: BP 109/60 mmHg  Pulse 58  Temp(Src) 98.8 F (37.1 C) (Oral)  Resp 14  Ht 5\' 4"  (1.626 m)  Wt 150 lb (68.04 kg)  BMI 25.73 kg/m2  SpO2 99%  LMP 04/29/2014 (Exact Date)  Physical Exam  Constitutional: She is oriented to person, place, and time. She appears well-developed and well-nourished. No distress.  HENT:  Head: Normocephalic.  Eyes: EOM are normal. Right eye exhibits no discharge. Left eye exhibits no discharge. No scleral icterus.  Pulmonary/Chest: Effort normal. No  stridor. No respiratory distress.  Abdominal: She exhibits no distension.  Musculoskeletal: Normal range of motion.  Neurological: She is alert and oriented to person, place, and time. Coordination normal.  Skin: Skin is warm and dry. She is not diaphoretic.  Psychiatric: She has a normal mood and affect. Her behavior is normal. Judgment and thought content normal.    Imaging: No results found.  Labs:  CBC: No results for input(s): WBC, HGB, HCT, PLT in the last 8760 hours.  COAGS: No results for input(s): INR, APTT in the last 8760 hours.  BMP: No results for input(s): NA, K, CL, CO2, GLUCOSE, BUN, CALCIUM, CREATININE, GFRNONAA, GFRAA in the last 8760 hours.  Invalid input(s): CMP  LIVER FUNCTION TESTS: No results for input(s): BILITOT, AST, ALT, ALKPHOS, PROT, ALBUMIN in the last 8760 hours.  TUMOR MARKERS: No results for input(s): AFPTM, CEA, CA199, CHROMGRNA in the last 8760 hours.  Assessment and Plan:  My impression is that this pleasant patient has symptomatic uterine fibroids. We discussed the pathophysiology of uterine leiomyomata. We discussed treatment options including watchful waiting until menopause, hysterectomy, and uterine fibroid embolization. We discussed UFE technique, anticipated benefits, time course of symptom resolution, possible risks and complications, and need for continued gynecologic care. She seemed well educated and well-informed and did ask appropriate questions. She seemed motivated to proceed and wished to discuss her choice with family members. I gave her literature to review. In the event that she elects to proceed with UFE, we'll need to set up an outpatient pelvic MR with contrast to assess the exact location of all of her fibroids, specifically to exclude any pedunculated fibroids on a narrow stalk, and to exclude any unexpected pelvic pathology. Assuming this is okay, we can set up outpatient UFE with extended overnight stay for pain control, at  her convenience.  Thank you for this interesting consult.  I greatly enjoyed meeting Erica Richards and look forward to participating in their care. I'll keep you updated with her progress.  Signed: Chiniqua Kilcrease III, DAYNE Rigley Niess 05/29/2014, 9:46 AM   I spent a total of 30 minutes face to face in clinical consultation, greater than 50% of which was counseling/coordinating care for symptomatic uterine fibroids.

## 2014-06-01 ENCOUNTER — Other Ambulatory Visit: Payer: Self-pay | Admitting: Obstetrics & Gynecology

## 2014-06-01 DIAGNOSIS — D259 Leiomyoma of uterus, unspecified: Secondary | ICD-10-CM

## 2014-06-12 ENCOUNTER — Ambulatory Visit
Admission: RE | Admit: 2014-06-12 | Discharge: 2014-06-12 | Disposition: A | Discharge status: Home / Self Care | Payer: PRIVATE HEALTH INSURANCE | Source: Ambulatory Visit | Attending: Obstetrics & Gynecology | Admitting: Obstetrics & Gynecology

## 2014-06-12 DIAGNOSIS — D259 Leiomyoma of uterus, unspecified: Secondary | ICD-10-CM

## 2014-06-12 MED ORDER — GADOBENATE DIMEGLUMINE 529 MG/ML IV SOLN
13.0000 mL | Freq: Once | INTRAVENOUS | Status: AC | PRN
Start: 2014-06-12 — End: 2014-06-12
  Administered 2014-06-12: 13 mL via INTRAVENOUS

## 2014-06-15 ENCOUNTER — Other Ambulatory Visit: Payer: Self-pay | Admitting: Interventional Radiology

## 2014-06-15 DIAGNOSIS — D251 Intramural leiomyoma of uterus: Secondary | ICD-10-CM

## 2014-07-06 ENCOUNTER — Other Ambulatory Visit: Payer: Self-pay | Admitting: Radiology

## 2014-07-10 ENCOUNTER — Ambulatory Visit (HOSPITAL_COMMUNITY)
Admission: RE | Admit: 2014-07-10 | Discharge: 2014-07-11 | Disposition: A | Discharge status: Home / Self Care | Payer: PRIVATE HEALTH INSURANCE | Source: Ambulatory Visit | Attending: Interventional Radiology | Admitting: Interventional Radiology

## 2014-07-10 ENCOUNTER — Encounter (HOSPITAL_COMMUNITY): Payer: Self-pay

## 2014-07-10 ENCOUNTER — Ambulatory Visit (HOSPITAL_COMMUNITY)
Admission: RE | Admit: 2014-07-10 | Discharge: 2014-07-10 | Disposition: A | Discharge status: Home / Self Care | Payer: PRIVATE HEALTH INSURANCE | Source: Ambulatory Visit | Attending: Interventional Radiology | Admitting: Interventional Radiology

## 2014-07-10 DIAGNOSIS — J45909 Unspecified asthma, uncomplicated: Secondary | ICD-10-CM | POA: Diagnosis not present

## 2014-07-10 DIAGNOSIS — D251 Intramural leiomyoma of uterus: Secondary | ICD-10-CM | POA: Diagnosis present

## 2014-07-10 DIAGNOSIS — Z87891 Personal history of nicotine dependence: Secondary | ICD-10-CM | POA: Insufficient documentation

## 2014-07-10 DIAGNOSIS — D219 Benign neoplasm of connective and other soft tissue, unspecified: Secondary | ICD-10-CM

## 2014-07-10 DIAGNOSIS — Z882 Allergy status to sulfonamides status: Secondary | ICD-10-CM | POA: Insufficient documentation

## 2014-07-10 LAB — CBC WITH DIFFERENTIAL/PLATELET
BASOS PCT: 0 % (ref 0–1)
Basophils Absolute: 0 10*3/uL (ref 0.0–0.1)
Eosinophils Absolute: 0.4 10*3/uL (ref 0.0–0.7)
Eosinophils Relative: 4 % (ref 0–5)
HCT: 38 % (ref 36.0–46.0)
HEMOGLOBIN: 12.4 g/dL (ref 12.0–15.0)
Lymphocytes Relative: 21 % (ref 12–46)
Lymphs Abs: 2 10*3/uL (ref 0.7–4.0)
MCH: 31 pg (ref 26.0–34.0)
MCHC: 32.6 g/dL (ref 30.0–36.0)
MCV: 95 fL (ref 78.0–100.0)
MONO ABS: 0.7 10*3/uL (ref 0.1–1.0)
MONOS PCT: 7 % (ref 3–12)
Neutro Abs: 6.5 10*3/uL (ref 1.7–7.7)
Neutrophils Relative %: 68 % (ref 43–77)
Platelets: 254 10*3/uL (ref 150–400)
RBC: 4 MIL/uL (ref 3.87–5.11)
RDW: 13.1 % (ref 11.5–15.5)
WBC: 9.5 10*3/uL (ref 4.0–10.5)

## 2014-07-10 LAB — BASIC METABOLIC PANEL
ANION GAP: 9 (ref 5–15)
BUN: 16 mg/dL (ref 6–20)
CHLORIDE: 107 mmol/L (ref 101–111)
CO2: 22 mmol/L (ref 22–32)
Calcium: 8.8 mg/dL — ABNORMAL LOW (ref 8.9–10.3)
Creatinine, Ser: 0.93 mg/dL (ref 0.44–1.00)
Glucose, Bld: 96 mg/dL (ref 65–99)
POTASSIUM: 3.6 mmol/L (ref 3.5–5.1)
Sodium: 138 mmol/L (ref 135–145)

## 2014-07-10 LAB — PROTIME-INR
INR: 1 (ref 0.00–1.49)
Prothrombin Time: 13.4 seconds (ref 11.6–15.2)

## 2014-07-10 LAB — HCG, SERUM, QUALITATIVE: PREG SERUM: NEGATIVE

## 2014-07-10 MED ORDER — MONTELUKAST SODIUM 10 MG PO TABS
10.0000 mg | ORAL_TABLET | Freq: Every day | ORAL | Status: DC
  Administered 2014-07-10: 10 mg via ORAL
  Filled 2014-07-10 (×2): qty 1

## 2014-07-10 MED ORDER — SODIUM CHLORIDE 0.9 % IJ SOLN: 9.0000 mL | INTRAMUSCULAR | Status: DC | PRN

## 2014-07-10 MED ORDER — CEFAZOLIN SODIUM-DEXTROSE 2-3 GM-% IV SOLR
2.0000 g | Freq: Once | INTRAVENOUS | Status: AC
  Administered 2014-07-10: 2 g via INTRAVENOUS

## 2014-07-10 MED ORDER — MIDAZOLAM HCL 2 MG/2ML IJ SOLN
INTRAMUSCULAR | Status: AC | PRN
  Administered 2014-07-10: 0.5 mg via INTRAVENOUS
  Administered 2014-07-10: 1 mg via INTRAVENOUS

## 2014-07-10 MED ORDER — MIDAZOLAM HCL 2 MG/2ML IJ SOLN
INTRAMUSCULAR | Status: AC
  Filled 2014-07-10: qty 6

## 2014-07-10 MED ORDER — NORETHINDRONE-ETH ESTRADIOL 1-35 MG-MCG PO TABS
1.0000 | ORAL_TABLET | Freq: Every day | ORAL | Status: DC
  Administered 2014-07-10 – 2014-07-11 (×2): 1 via ORAL

## 2014-07-10 MED ORDER — PROMETHAZINE HCL 25 MG PO TABS: 25.0000 mg | ORAL_TABLET | Freq: Three times a day (TID) | ORAL | Status: DC | PRN

## 2014-07-10 MED ORDER — HYDROMORPHONE 0.3 MG/ML IV SOLN
INTRAVENOUS | Status: AC
  Administered 2014-07-10: 11:00:00
  Filled 2014-07-10: qty 25

## 2014-07-10 MED ORDER — CEFAZOLIN SODIUM-DEXTROSE 2-3 GM-% IV SOLR
INTRAVENOUS | Status: AC
  Filled 2014-07-10: qty 50

## 2014-07-10 MED ORDER — SODIUM CHLORIDE 0.9 % IJ SOLN: 3.0000 mL | Freq: Two times a day (BID) | INTRAMUSCULAR | Status: DC

## 2014-07-10 MED ORDER — ATROPINE SULFATE 0.1 MG/ML IJ SOLN
INTRAMUSCULAR | Status: AC
  Administered 2014-07-10: 0.5 mg
  Filled 2014-07-10: qty 10

## 2014-07-10 MED ORDER — SODIUM CHLORIDE 0.9 % IV SOLN: 250.0000 mL | INTRAVENOUS | Status: DC | PRN

## 2014-07-10 MED ORDER — KETOROLAC TROMETHAMINE 30 MG/ML IJ SOLN
INTRAMUSCULAR | Status: AC
  Filled 2014-07-10: qty 1

## 2014-07-10 MED ORDER — IOHEXOL 300 MG/ML  SOLN
100.0000 mL | Freq: Once | INTRAMUSCULAR | Status: AC | PRN
  Administered 2014-07-10: 55 mL via INTRA_ARTERIAL

## 2014-07-10 MED ORDER — HYDROMORPHONE HCL 2 MG/ML IJ SOLN
INTRAMUSCULAR | Status: AC
  Filled 2014-07-10: qty 1

## 2014-07-10 MED ORDER — SODIUM CHLORIDE 0.9 % IV SOLN
250.0000 mL | INTRAVENOUS | Status: DC | PRN
Start: 2014-07-10 — End: 2014-07-10

## 2014-07-10 MED ORDER — KETOROLAC TROMETHAMINE 30 MG/ML IJ SOLN
30.0000 mg | Freq: Four times a day (QID) | INTRAMUSCULAR | Status: DC
  Administered 2014-07-10 – 2014-07-11 (×4): 30 mg via INTRAVENOUS
  Filled 2014-07-10 (×7): qty 1

## 2014-07-10 MED ORDER — DOCUSATE SODIUM 100 MG PO CAPS: 100.0000 mg | ORAL_CAPSULE | Freq: Two times a day (BID) | ORAL | Status: DC

## 2014-07-10 MED ORDER — ONDANSETRON HCL 4 MG/2ML IJ SOLN
4.0000 mg | Freq: Four times a day (QID) | INTRAMUSCULAR | Status: DC | PRN
  Administered 2014-07-10: 4 mg via INTRAVENOUS
  Filled 2014-07-10: qty 2

## 2014-07-10 MED ORDER — ONDANSETRON HCL 4 MG/2ML IJ SOLN: 4.0000 mg | Freq: Four times a day (QID) | INTRAMUSCULAR | Status: DC | PRN

## 2014-07-10 MED ORDER — FENTANYL CITRATE (PF) 100 MCG/2ML IJ SOLN
INTRAMUSCULAR | Status: AC
  Filled 2014-07-10: qty 4

## 2014-07-10 MED ORDER — LIDOCAINE HCL 1 % IJ SOLN
INTRAMUSCULAR | Status: AC
  Filled 2014-07-10: qty 20

## 2014-07-10 MED ORDER — FENTANYL CITRATE (PF) 100 MCG/2ML IJ SOLN
INTRAMUSCULAR | Status: AC | PRN
  Administered 2014-07-10: 25 ug via INTRAVENOUS
  Administered 2014-07-10: 50 ug via INTRAVENOUS

## 2014-07-10 MED ORDER — DIPHENHYDRAMINE HCL 50 MG/ML IJ SOLN: 12.5000 mg | Freq: Four times a day (QID) | INTRAMUSCULAR | Status: DC | PRN

## 2014-07-10 MED ORDER — SODIUM CHLORIDE 0.9 % IJ SOLN: 3.0000 mL | INTRAMUSCULAR | Status: DC | PRN

## 2014-07-10 MED ORDER — SODIUM CHLORIDE 0.9 % IV SOLN
INTRAVENOUS | Status: DC
  Administered 2014-07-10: 08:00:00 via INTRAVENOUS

## 2014-07-10 MED ORDER — PROMETHAZINE HCL 25 MG RE SUPP: 25.0000 mg | Freq: Three times a day (TID) | RECTAL | Status: DC | PRN

## 2014-07-10 MED ORDER — SODIUM CHLORIDE 0.9 % IV SOLN
INTRAVENOUS | Status: DC
  Administered 2014-07-10 – 2014-07-11 (×2): via INTRAVENOUS

## 2014-07-10 MED ORDER — CETYLPYRIDINIUM CHLORIDE 0.05 % MT LIQD
7.0000 mL | Freq: Two times a day (BID) | OROMUCOSAL | Status: DC
  Administered 2014-07-10 – 2014-07-11 (×2): 7 mL via OROMUCOSAL

## 2014-07-10 MED ORDER — KETOROLAC TROMETHAMINE 30 MG/ML IJ SOLN
30.0000 mg | Freq: Once | INTRAMUSCULAR | Status: AC
  Administered 2014-07-10: 30 mg via INTRAVENOUS

## 2014-07-10 MED ORDER — HYDROMORPHONE 0.3 MG/ML IV SOLN
INTRAVENOUS | Status: DC
  Administered 2014-07-10: 1.5 mg via INTRAVENOUS
  Administered 2014-07-10: 3.6 mg via INTRAVENOUS
  Administered 2014-07-10: 0.9 mg via INTRAVENOUS
  Administered 2014-07-11: 0.3 mg via INTRAVENOUS
  Administered 2014-07-11: 0.598 mg via INTRAVENOUS
  Administered 2014-07-11: 0.3 mg via INTRAVENOUS
  Filled 2014-07-10 (×2): qty 25

## 2014-07-10 MED ORDER — HYDROCODONE-ACETAMINOPHEN 5-325 MG PO TABS
1.0000 | ORAL_TABLET | ORAL | Status: DC | PRN
  Administered 2014-07-11: 1 via ORAL
  Filled 2014-07-10: qty 1

## 2014-07-10 MED ORDER — DIPHENHYDRAMINE HCL 12.5 MG/5ML PO ELIX
12.5000 mg | ORAL_SOLUTION | Freq: Four times a day (QID) | ORAL | Status: DC | PRN
  Filled 2014-07-10: qty 5

## 2014-07-10 MED ORDER — DOCUSATE SODIUM 100 MG PO CAPS
100.0000 mg | ORAL_CAPSULE | Freq: Two times a day (BID) | ORAL | Status: DC
  Administered 2014-07-10 – 2014-07-11 (×2): 100 mg via ORAL
  Filled 2014-07-10 (×3): qty 1

## 2014-07-10 MED ORDER — NALOXONE HCL 0.4 MG/ML IJ SOLN: 0.4000 mg | INTRAMUSCULAR | Status: DC | PRN

## 2014-07-10 NOTE — Progress Notes (Signed)
Patient's PR ranges from 48-61/min, asymptomatic,no chest pain,denies dizziness. Alert and orientedx3. PA- Lennette Bihari notified. Will continue to monitor the patient.

## 2014-07-10 NOTE — Plan of Care (Signed)
Problem: Phase I Progression Outcomes Goal: Voiding-avoid urinary catheter unless indicated Outcome: Completed/Met Date Met:  07/10/14 Foley d/c'd

## 2014-07-10 NOTE — Progress Notes (Signed)
Patient arrived to floor at 1120. Alert and oriented x3. Dsg to R groin clean,dry and intact,. Placed comfortably in bed.Will continue to monitor the patient.

## 2014-07-10 NOTE — Plan of Care (Signed)
Problem: Phase I Progression Outcomes Goal: Other Phase I Outcomes/Goals Nausea/vomiting Outcome: Progressing Pt with nausea from dilaudid PCA, antiemetics given, pt requested heating pad

## 2014-07-10 NOTE — Procedures (Signed)
Uterine Fibroid Embolization via R CFA No complication No blood loss. See complete dictation in [facility].

## 2014-07-10 NOTE — Sedation Documentation (Signed)
44fr sheath removed from right fem artery by Dr. Vernard Gambles. Hemostasis achieved using exoseal closure device and manual pressure held by Lambert Mody, RTR for 2 minutes. RDP +1, RPTP +3, groin level 0.

## 2014-07-10 NOTE — H&P (Signed)
Chief Complaint: Uterine Fibroids  Referring Physician(s): Hassell,Daniel  History of Present Illness: Erica Richards is a 49 y.o. female who was diagnosed with uterine fibroids in February of this year as part of her workup for UTI.  She states that she has had increasingly heavy periods over the past 3 years, with her periods lasting 4 days with 4 days of heavy bleeding, no clots, requiring changing pads every 6-8 hours, as well as some interperiod bleeding.   She also describes some pelvic/abdominal bloating symptoms, urinary frequency, and pelvic pressure.   Besides birth control pills, she's had no previous fibroid therapy.   Ultrasound 05/18/2014 demonstrated multiple fibroids up to 4 cm.  She is here today to undergo uterine artery embolization for treatment of symptomatic fibroids   Past Medical History  Diagnosis Date  . Asthma   . Allergy   . Headache(784.0)   . Fibroids     History reviewed. No pertinent past surgical history.  Allergies: Augmentin and Sulfa antibiotics  Medications: Prior to Admission medications   Medication Sig Start Date End Date Taking? Authorizing Provider  montelukast (SINGULAIR) 10 MG tablet Take 10 mg by mouth at bedtime.   Yes Historical Provider, MD  norethindrone-ethinyl estradiol 1/35 (ORTHO-NOVUM, NORTREL,CYCLAFEM) tablet Take 1 tablet by mouth daily.   Yes Historical Provider, MD  valACYclovir (VALTREX) 1000 MG tablet TAKE 2 TABLETS AT OUTBREAK ONSET THEN REPEAT IN 12 HOURS FOR EACH OUTBREAK.   Yes Mancel Bale, PA-C  amoxicillin (AMOXIL) 875 MG tablet Take 1 tablet (875 mg total) by mouth 2 (two) times daily. Patient not taking: Reported on 05/29/2014 12/28/13   Dorian Heckle English, PA  azelastine (ASTELIN) 0.1 % nasal spray Place 2 sprays into both nostrils 2 (two) times daily. Use in each nostril as directed Patient not taking: Reported on 05/29/2014 12/28/13   Dorian Heckle English, PA  cetirizine (ZYRTEC) 10 MG tablet  Take 1 tablet (10 mg total) by mouth daily. Patient not taking: Reported on 07/10/2014 12/28/13   Dorian Heckle English, PA  meclizine (ANTIVERT) 12.5 MG tablet Take 1 tablet (12.5 mg total) by mouth 3 (three) times daily as needed for dizziness. Patient not taking: Reported on 05/29/2014 06/30/13   Robyn Haber, MD     History reviewed. No pertinent family history.  History   Social History  . Marital Status: Married    Spouse Name: Hassell Done  . Number of Children: 2  . Years of Education: 16   Occupational History  . 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 Smoking status: Former Smoker    Types: Cigarettes    Quit date: 11/11/1994  . Smokeless tobacco: Never Used  . Alcohol Use: No  . Drug Use: No  . Sexual Activity:    Partners: Male    Birth Control/ Protection: Pill   Other Topics Concern  . None   Social History Narrative   Lives with her husband. Older daughter is a full-time Ship broker at [employer], scheduled to graduate in 01/2012. Younger daughter lives with them part-time, and part-time with her father.     Review of Systems: A 12 point ROS discussed and pertinent positives are indicated in the HPI above.  All other systems are negative.  Review of Systems  Constitutional: Negative for fever, activity change, appetite change and fatigue.  Respiratory: Negative for cough, chest tightness and shortness of breath.   Cardiovascular: Negative for chest pain.  Gastrointestinal: Negative for nausea and vomiting.       Pelvic/Abdominal bloating and pressure  Genitourinary: Positive for frequency.  Musculoskeletal: Negative for back pain and gait problem.  Skin: Negative.   Neurological: Negative.   Psychiatric/Behavioral: Negative.     Vital Signs: BP 104/70 mmHg  Pulse 64  Temp(Src) 98.3 F (36.8 C) (Oral)  Resp 16  Ht 5\' 4"  (1.626 m)  Wt 150 lb (68.04 kg)  BMI 25.73 kg/m2  SpO2 100%  LMP 06/26/2014  Physical  Exam  Constitutional: She is oriented to person, place, and time. She appears well-developed and well-nourished.  HENT:  Head: Normocephalic and atraumatic.  Eyes: EOM are normal.  Neck: Normal range of motion. Neck supple.  Cardiovascular: Normal rate, regular rhythm and normal heart sounds.   No murmur heard. Pulmonary/Chest: Effort normal and breath sounds normal. She has no wheezes.  Abdominal: Soft. Bowel sounds are normal.  Musculoskeletal: Normal range of motion.  Neurological: She is alert and oriented to person, place, and time.  Skin: Skin is warm and dry.  Psychiatric: She has a normal mood and affect. Her behavior is normal. Judgment and thought content normal.  Vitals reviewed.   Mallampati Score:  MD Evaluation Airway: WNL Heart: WNL Abdomen: WNL Chest/ Lungs: WNL ASA  Classification: 1 Mallampati/Airway Score: One  Imaging: Mr Pelvis W Wo Contrast  06/12/2014   CLINICAL DATA:  Uterine fibroids, pre [place]  EXAM: MRI PELVIS WITHOUT AND WITH CONTRAST  TECHNIQUE: Multiplanar multisequence MR imaging of the pelvis was performed both before and after administration of intravenous contrast.  CONTRAST:  52mL MULTIHANCE GADOBENATE DIMEGLUMINE 529 MG/ML IV SOLN  COMPARISON:  None.  FINDINGS: Enlarged uterus, measuring 11.9 x 7.9 x 2.8 cm.  Numerous uterine fibroids, approximately 15 in number, including:  --5.2 x 5.2 x 5.0 cm degenerating subserosal fibroid in the anterior uterine body (series 4/image 10)  --2.0 x 2.8 x 2.1 cm submucosal posterior uterine body fibroid (series 4/ image 14)  --4.1 x 3.1 x 3.5 cm subserosal posterior uterine body fibroid (series 4/image 16)  All fibroids enhance following contrast administration except for the dominant degenerating anterior uterine body fibroid.  Endometrial complex is distorted by multiple fibroids but measures approximately 3 mm.  Right ovary measures approximately 1.8 x 1.2 cm but is poorly visualized (series 10/image 14). Left ovary  is not discretely visualized.  Bladder is underdistended.  Trace pelvic ascites (series 5/image 15).  No suspicious pelvic lymphadenopathy.  No focal osseous lesions.  IMPRESSION: Enlarged uterus with multiple uterine fibroids, including a dominant 5.2 cm degenerating fibroid in the anterior uterine body, as described above.   Electronically Signed   By: Julian Hy M.D.   On: 06/12/2014 09:51    Labs:  CBC:  Recent Labs  07/10/14 0820  WBC 9.5  HGB 12.4  HCT 38.0  PLT 254    COAGS:  Recent Labs  07/10/14 0820  INR 1.00    BMP:  Recent Labs  07/10/14 0820  NA 138  K 3.6  CL 107  CO2 22  GLUCOSE 96  BUN 16  CALCIUM 8.8*  CREATININE 0.93  GFRNONAA >60  GFRAA >60    LIVER FUNCTION TESTS: No results for input(s): BILITOT, AST, ALT, ALKPHOS, PROT, ALBUMIN in the last 8760 hours.  TUMOR MARKERS: No results for input(s): AFPTM, CEA, CA199, CHROMGRNA in the last 8760 hours.  Assessment and Plan:  Symptomatic Uterine Fibroids  Will proceed with Uterine artery embolization by Dr.  Vernard Gambles  today.  Thank you for this interesting consult.  I greatly enjoyed meeting Erica Richards and look forward to participating in their care.  SignedNevin Bloodgood 07/10/2014, 9:23 AM   I spent a total of  15 minutes in face to face in clinical consultation, greater than 50% of which was counseling/coordinating care for Uterine artery embolization.

## 2014-07-10 NOTE — Progress Notes (Signed)
Day of Surgery  Subjective: Pt resting well; denies CP,dyspnea, cough,N/V; has some mild pelvic "pressure"  Objective: Vital signs in last 24 hours: Temp:  [97.7 F (36.5 C)-98.4 F (36.9 C)] 97.7 F (36.5 C) (05/31 1549) Pulse Rate:  [36-84] 52 (05/31 1549) Resp:  [9-18] 13 (05/31 1600) BP: (98-124)/(45-78) 107/53 mmHg (05/31 1549) SpO2:  [92 %-100 %] 94 % (05/31 1600) Weight:  [150 lb (68.04 kg)-152 lb 14.4 oz (69.355 kg)] 152 lb 14.4 oz (69.355 kg) (05/31 1145) Last BM Date: 07/10/14  Intake/Output from previous day:   Intake/Output this shift: Total I/O In: 10 [I.V.:10] Out: -   Pt drowsy but arousable; chest- CTA bilat; heart- sinus brady; abd- soft,+BS,minimal mid pelvic tenderness; ext- FROM, intact distal pulses; rt groin/CFA puncture site soft,no hematoma,NT; yellow urine in foley  Lab Results:   Recent Labs  07/10/14 0820  WBC 9.5  HGB 12.4  HCT 38.0  PLT 254   BMET  Recent Labs  07/10/14 0820  NA 138  K 3.6  CL 107  CO2 22  GLUCOSE 96  BUN 16  CREATININE 0.93  CALCIUM 8.8*   PT/INR  Recent Labs  07/10/14 0820  LABPROT 13.4  INR 1.00   ABG No results for input(s): PHART, HCO3 in the last 72 hours.  Invalid input(s): PCO2, PO2  Studies/Results: Ir Angiogram Pelvis Selective Or Supraselective  07/10/2014   CLINICAL DATA:  Symptomatic uterine fibroids. See previous consultation.  FLUOROSCOPY TIME:  12.3 minutes, 919.71  uGym2 DAP, 9 exposures  EXAM: EXAM BILATERAL UTERINE ARTERY EMBOLIZATION  TECHNIQUE: The procedure, risks, benefits, and alternatives were explained to the patient. Questions regarding the procedure were encouraged and answered. The patient understands and consents to the procedure. As antibiotic prophylaxis, cefazolin 2 g was ordered pre-procedure and administered intravenously within one hour of incision.An appropriate skin entry site was determined under fluoroscopy. Skin site was marked, prepped with Betadine, and draped in  usual sterile fashion, and infiltrated locally with 1% lidocaine.  Intravenous Fentanyl and Versed were administered as conscious sedation during continuous cardiorespiratory monitoring by the radiology RN, with a total moderate sedation time of 32 minutes.  Under real-time ultrasound guidance, the right common femoral artery was accessed with a micropuncture needle set using singlewall technique in a single pass. Ultrasound imaging documentation was saved. Micropuncture dilator exchanged over a Benson wire for a 5 [place] vascular sheath through which a C2 catheter was used to selectively catheterize the left internal iliac artery for pelvic arteriography. A coaxial Progreat catheter was advanced with the Terumo wire and used to selectively catheterize the left uterine artery. The microcatheter tip was positioned in the distal horizontal segment. Selective arteriogram confirms appropriate positioning. Distal branches of the left uterine artery were embolized with 500-700 micron Embospheres. Embolization continued until near stasis of flow was achieved. Microcatheter was withdrawn and a followup selective left internal iliac arteriogram was obtained. The C2 was withdrawn into the right common iliac artery, and the right internal iliac artery was selectively catheterized. Again the Progreat catheter with Terumo guidewire was coaxially advanced and used to selectively catheterize the right uterine artery. Confirmatory arteriogram was performed. Right uterine artery branches were embolized with 500-700 micron Embospheres to near stasis of flow. A total of 3 vials of Embospheres were utilized for the case. Microcatheter was withdrawn and a followup arteriogram of the right internal iliac artery was performed. C2 catheter removed. After confirmatory femoral angiogram, sheath removed and using the Exoseal device hemostasis was achieved. Patient  tolerated the procedure well.  COMPLICATIONS: COMPLICATIONS none  IMPRESSION:  1. Technically successful bilateral uterine artery embolization using 500-700 micron Embospheres.   Electronically Signed   By: Lucrezia Europe M.D.   On: 07/10/2014 11:16   Ir Angiogram Pelvis Selective Or Supraselective  07/10/2014   CLINICAL DATA:  Symptomatic uterine fibroids. See previous consultation.  FLUOROSCOPY TIME:  12.3 minutes, 919.71  uGym2 DAP, 9 exposures  EXAM: EXAM BILATERAL UTERINE ARTERY EMBOLIZATION  TECHNIQUE: The procedure, risks, benefits, and alternatives were explained to the patient. Questions regarding the procedure were encouraged and answered. The patient understands and consents to the procedure. As antibiotic prophylaxis, cefazolin 2 g was ordered pre-procedure and administered intravenously within one hour of incision.An appropriate skin entry site was determined under fluoroscopy. Skin site was marked, prepped with Betadine, and draped in usual sterile fashion, and infiltrated locally with 1% lidocaine.  Intravenous Fentanyl and Versed were administered as conscious sedation during continuous cardiorespiratory monitoring by the radiology RN, with a total moderate sedation time of 32 minutes.  Under real-time ultrasound guidance, the right common femoral artery was accessed with a micropuncture needle set using singlewall technique in a single pass. Ultrasound imaging documentation was saved. Micropuncture dilator exchanged over a Benson wire for a 5 [place] vascular sheath through which a C2 catheter was used to selectively catheterize the left internal iliac artery for pelvic arteriography. A coaxial Progreat catheter was advanced with the Terumo wire and used to selectively catheterize the left uterine artery. The microcatheter tip was positioned in the distal horizontal segment. Selective arteriogram confirms appropriate positioning. Distal branches of the left uterine artery were embolized with 500-700 micron Embospheres. Embolization continued until near stasis of flow was  achieved. Microcatheter was withdrawn and a followup selective left internal iliac arteriogram was obtained. The C2 was withdrawn into the right common iliac artery, and the right internal iliac artery was selectively catheterized. Again the Progreat catheter with Terumo guidewire was coaxially advanced and used to selectively catheterize the right uterine artery. Confirmatory arteriogram was performed. Right uterine artery branches were embolized with 500-700 micron Embospheres to near stasis of flow. A total of 3 vials of Embospheres were utilized for the case. Microcatheter was withdrawn and a followup arteriogram of the right internal iliac artery was performed. C2 catheter removed. After confirmatory femoral angiogram, sheath removed and using the Exoseal device hemostasis was achieved. Patient tolerated the procedure well.  COMPLICATIONS: COMPLICATIONS none  IMPRESSION: 1. Technically successful bilateral uterine artery embolization using 500-700 micron Embospheres.   Electronically Signed   By: Lucrezia Europe M.D.   On: 07/10/2014 11:16   Ir Angiogram Selective Each Additional Vessel  07/10/2014   CLINICAL DATA:  Symptomatic uterine fibroids. See previous consultation.  FLUOROSCOPY TIME:  12.3 minutes, 919.71  uGym2 DAP, 9 exposures  EXAM: EXAM BILATERAL UTERINE ARTERY EMBOLIZATION  TECHNIQUE: The procedure, risks, benefits, and alternatives were explained to the patient. Questions regarding the procedure were encouraged and answered. The patient understands and consents to the procedure. As antibiotic prophylaxis, cefazolin 2 g was ordered pre-procedure and administered intravenously within one hour of incision.An appropriate skin entry site was determined under fluoroscopy. Skin site was marked, prepped with Betadine, and draped in usual sterile fashion, and infiltrated locally with 1% lidocaine.  Intravenous Fentanyl and Versed were administered as conscious sedation during continuous cardiorespiratory  monitoring by the radiology RN, with a total moderate sedation time of 32 minutes.  Under real-time ultrasound guidance, the right common femoral artery was accessed  with a micropuncture needle set using singlewall technique in a single pass. Ultrasound imaging documentation was saved. Micropuncture dilator exchanged over a Benson wire for a 5 [place] vascular sheath through which a C2 catheter was used to selectively catheterize the left internal iliac artery for pelvic arteriography. A coaxial Progreat catheter was advanced with the Terumo wire and used to selectively catheterize the left uterine artery. The microcatheter tip was positioned in the distal horizontal segment. Selective arteriogram confirms appropriate positioning. Distal branches of the left uterine artery were embolized with 500-700 micron Embospheres. Embolization continued until near stasis of flow was achieved. Microcatheter was withdrawn and a followup selective left internal iliac arteriogram was obtained. The C2 was withdrawn into the right common iliac artery, and the right internal iliac artery was selectively catheterized. Again the Progreat catheter with Terumo guidewire was coaxially advanced and used to selectively catheterize the right uterine artery. Confirmatory arteriogram was performed. Right uterine artery branches were embolized with 500-700 micron Embospheres to near stasis of flow. A total of 3 vials of Embospheres were utilized for the case. Microcatheter was withdrawn and a followup arteriogram of the right internal iliac artery was performed. C2 catheter removed. After confirmatory femoral angiogram, sheath removed and using the Exoseal device hemostasis was achieved. Patient tolerated the procedure well.  COMPLICATIONS: COMPLICATIONS none  IMPRESSION: 1. Technically successful bilateral uterine artery embolization using 500-700 micron Embospheres.   Electronically Signed   By: Lucrezia Europe M.D.   On: 07/10/2014 11:16   Ir  Angiogram Selective Each Additional Vessel  07/10/2014   CLINICAL DATA:  Symptomatic uterine fibroids. See previous consultation.  FLUOROSCOPY TIME:  12.3 minutes, 919.71  uGym2 DAP, 9 exposures  EXAM: EXAM BILATERAL UTERINE ARTERY EMBOLIZATION  TECHNIQUE: The procedure, risks, benefits, and alternatives were explained to the patient. Questions regarding the procedure were encouraged and answered. The patient understands and consents to the procedure. As antibiotic prophylaxis, cefazolin 2 g was ordered pre-procedure and administered intravenously within one hour of incision.An appropriate skin entry site was determined under fluoroscopy. Skin site was marked, prepped with Betadine, and draped in usual sterile fashion, and infiltrated locally with 1% lidocaine.  Intravenous Fentanyl and Versed were administered as conscious sedation during continuous cardiorespiratory monitoring by the radiology RN, with a total moderate sedation time of 32 minutes.  Under real-time ultrasound guidance, the right common femoral artery was accessed with a micropuncture needle set using singlewall technique in a single pass. Ultrasound imaging documentation was saved. Micropuncture dilator exchanged over a Benson wire for a 5 [place] vascular sheath through which a C2 catheter was used to selectively catheterize the left internal iliac artery for pelvic arteriography. A coaxial Progreat catheter was advanced with the Terumo wire and used to selectively catheterize the left uterine artery. The microcatheter tip was positioned in the distal horizontal segment. Selective arteriogram confirms appropriate positioning. Distal branches of the left uterine artery were embolized with 500-700 micron Embospheres. Embolization continued until near stasis of flow was achieved. Microcatheter was withdrawn and a followup selective left internal iliac arteriogram was obtained. The C2 was withdrawn into the right common iliac artery, and the right  internal iliac artery was selectively catheterized. Again the Progreat catheter with Terumo guidewire was coaxially advanced and used to selectively catheterize the right uterine artery. Confirmatory arteriogram was performed. Right uterine artery branches were embolized with 500-700 micron Embospheres to near stasis of flow. A total of 3 vials of Embospheres were utilized for the case. Microcatheter was withdrawn and  a followup arteriogram of the right internal iliac artery was performed. C2 catheter removed. After confirmatory femoral angiogram, sheath removed and using the Exoseal device hemostasis was achieved. Patient tolerated the procedure well.  COMPLICATIONS: COMPLICATIONS none  IMPRESSION: 1. Technically successful bilateral uterine artery embolization using 500-700 micron Embospheres.   Electronically Signed   By: Lucrezia Europe M.D.   On: 07/10/2014 11:16   Ir US Guide Vasc Access Right  07/10/2014   CLINICAL DATA:  Symptomatic uterine fibroids. See previous consultation.  FLUOROSCOPY TIME:  12.3 minutes, 919.71  uGym2 DAP, 9 exposures  EXAM: EXAM BILATERAL UTERINE ARTERY EMBOLIZATION  TECHNIQUE: The procedure, risks, benefits, and alternatives were explained to the patient. Questions regarding the procedure were encouraged and answered. The patient understands and consents to the procedure. As antibiotic prophylaxis, cefazolin 2 g was ordered pre-procedure and administered intravenously within one hour of incision.An appropriate skin entry site was determined under fluoroscopy. Skin site was marked, prepped with Betadine, and draped in usual sterile fashion, and infiltrated locally with 1% lidocaine.  Intravenous Fentanyl and Versed were administered as conscious sedation during continuous cardiorespiratory monitoring by the radiology RN, with a total moderate sedation time of 32 minutes.  Under real-time ultrasound guidance, the right common femoral artery was accessed with a micropuncture needle set  using singlewall technique in a single pass. Ultrasound imaging documentation was saved. Micropuncture dilator exchanged over a Benson wire for a 5 [place] vascular sheath through which a C2 catheter was used to selectively catheterize the left internal iliac artery for pelvic arteriography. A coaxial Progreat catheter was advanced with the Terumo wire and used to selectively catheterize the left uterine artery. The microcatheter tip was positioned in the distal horizontal segment. Selective arteriogram confirms appropriate positioning. Distal branches of the left uterine artery were embolized with 500-700 micron Embospheres. Embolization continued until near stasis of flow was achieved. Microcatheter was withdrawn and a followup selective left internal iliac arteriogram was obtained. The C2 was withdrawn into the right common iliac artery, and the right internal iliac artery was selectively catheterized. Again the Progreat catheter with Terumo guidewire was coaxially advanced and used to selectively catheterize the right uterine artery. Confirmatory arteriogram was performed. Right uterine artery branches were embolized with 500-700 micron Embospheres to near stasis of flow. A total of 3 vials of Embospheres were utilized for the case. Microcatheter was withdrawn and a followup arteriogram of the right internal iliac artery was performed. C2 catheter removed. After confirmatory femoral angiogram, sheath removed and using the Exoseal device hemostasis was achieved. Patient tolerated the procedure well.  COMPLICATIONS: COMPLICATIONS none  IMPRESSION: 1. Technically successful bilateral uterine artery embolization using 500-700 micron Embospheres.   Electronically Signed   By: Lucrezia Europe M.D.   On: 07/10/2014 11:16   Ir Embo Tumor Organ Ischemia Infarct Inc Guide Roadmapping  07/10/2014   CLINICAL DATA:  Symptomatic uterine fibroids. See previous consultation.  FLUOROSCOPY TIME:  12.3 minutes, 919.71  uGym2 DAP, 9  exposures  EXAM: EXAM BILATERAL UTERINE ARTERY EMBOLIZATION  TECHNIQUE: The procedure, risks, benefits, and alternatives were explained to the patient. Questions regarding the procedure were encouraged and answered. The patient understands and consents to the procedure. As antibiotic prophylaxis, cefazolin 2 g was ordered pre-procedure and administered intravenously within one hour of incision.An appropriate skin entry site was determined under fluoroscopy. Skin site was marked, prepped with Betadine, and draped in usual sterile fashion, and infiltrated locally with 1% lidocaine.  Intravenous Fentanyl and Versed were administered as  conscious sedation during continuous cardiorespiratory monitoring by the radiology RN, with a total moderate sedation time of 32 minutes.  Under real-time ultrasound guidance, the right common femoral artery was accessed with a micropuncture needle set using singlewall technique in a single pass. Ultrasound imaging documentation was saved. Micropuncture dilator exchanged over a Benson wire for a 5 [place] vascular sheath through which a C2 catheter was used to selectively catheterize the left internal iliac artery for pelvic arteriography. A coaxial Progreat catheter was advanced with the Terumo wire and used to selectively catheterize the left uterine artery. The microcatheter tip was positioned in the distal horizontal segment. Selective arteriogram confirms appropriate positioning. Distal branches of the left uterine artery were embolized with 500-700 micron Embospheres. Embolization continued until near stasis of flow was achieved. Microcatheter was withdrawn and a followup selective left internal iliac arteriogram was obtained. The C2 was withdrawn into the right common iliac artery, and the right internal iliac artery was selectively catheterized. Again the Progreat catheter with Terumo guidewire was coaxially advanced and used to selectively catheterize the right uterine artery.  Confirmatory arteriogram was performed. Right uterine artery branches were embolized with 500-700 micron Embospheres to near stasis of flow. A total of 3 vials of Embospheres were utilized for the case. Microcatheter was withdrawn and a followup arteriogram of the right internal iliac artery was performed. C2 catheter removed. After confirmatory femoral angiogram, sheath removed and using the Exoseal device hemostasis was achieved. Patient tolerated the procedure well.  COMPLICATIONS: COMPLICATIONS none  IMPRESSION: 1. Technically successful bilateral uterine artery embolization using 500-700 micron Embospheres.   Electronically Signed   By: Lucrezia Europe M.D.   On: 07/10/2014 11:16    Anti-infectives: Anti-infectives    Start     Dose/Rate Route Frequency Ordered Stop   07/10/14 0905  ceFAZolin (ANCEF) 2-3 GM-% IVPB SOLR    Comments:  Carron Brazen   : cabinet override      07/10/14 5035 07/10/14 2114   07/10/14 0745  ceFAZolin (ANCEF) IVPB 2 g/50 mL premix     2 g 100 mL/hr over 30 Minutes Intravenous  Once 07/10/14 0735 07/10/14 1004      Assessment/Plan: Symptomatic uterine fibroids, s/p bilat [place] 5/31; remove foley cath; advance diet as tolerated; dilaudid PCA for pain; f/u with Dr. Vernard Gambles in [place] [facility] in 4 weeks     Erica Richards 07/10/2014

## 2014-07-10 NOTE — Sedation Documentation (Signed)
Pt experienced an episode of bradycardia. Heart rate in the 30s post procedure. Dr. Vernard Gambles notified and 0.5mg  of atropine given. Patients heart rate now in the 70s-80s.

## 2014-07-11 DIAGNOSIS — D251 Intramural leiomyoma of uterus: Secondary | ICD-10-CM | POA: Diagnosis not present

## 2014-07-11 NOTE — Progress Notes (Signed)
Patient's d/c instructions given,verbalized understanding. Pain is controlled. Puncture site to R groin soft, no s/s of bleeding. Patient is stable. No c/o n/v.

## 2014-07-11 NOTE — Plan of Care (Signed)
Problem: Phase I Progression Outcomes Goal: OOB as tolerated unless otherwise ordered Outcome: Completed/Met Date Met:  07/11/14 Patient ambulated in the hallway,accompanied by husband.

## 2014-07-11 NOTE — Discharge Summary (Signed)
Patient ID: Erica Richards MRN: [id number] DOB/AGE: [birth year] 49 y.o.  Admit date: 07/10/2014 Discharge date: 07/11/2014  Admission Diagnoses: Symptomatic uterine fibroids  Discharge Diagnoses: S/P Bilateral uterine artery embolization 07/10/14 Active Problems:   Intramural leiomyoma of uterus   Fibroids, intramural   Discharged Condition: good  Hospital Course: Patient with symptomatic uterine fibroids who underwent bilateral uterine artery fibroid embolization by Dr. Vernard Richards on 07/10/14.  She did well post-operatively and pain was controlled with Dilaudid PCA.  She did experience some mild nausea and PCA was stopped.  She is currently doing much better, tolerating PO, ambulating well, and is now ready for discharge home.  Consults: None  Significant Diagnostic Studies:  Results for orders placed or performed during the hospital encounter of 41/63/84  Basic metabolic panel  Result Value Ref Range   Sodium 138 135 - 145 mmol/L   Potassium 3.6 3.5 - 5.1 mmol/L   Chloride 107 101 - 111 mmol/L   CO2 22 22 - 32 mmol/L   Glucose, Bld 96 65 - 99 mg/dL   BUN 16 6 - 20 mg/dL   Creatinine, Ser 0.93 0.44 - 1.00 mg/dL   Calcium 8.8 (L) 8.9 - 10.3 mg/dL   GFR calc non Af Amer >60 >60 mL/min   GFR calc Af Amer >60 >60 mL/min   Anion gap 9 5 - 15  CBC with Differential/Platelet  Result Value Ref Range   WBC 9.5 4.0 - 10.5 K/uL   RBC 4.00 3.87 - 5.11 MIL/uL   Hemoglobin 12.4 12.0 - 15.0 g/dL   HCT 38.0 36.0 - 46.0 %   MCV 95.0 78.0 - 100.0 fL   MCH 31.0 26.0 - 34.0 pg   MCHC 32.6 30.0 - 36.0 g/dL   RDW 13.1 11.5 - 15.5 %   Platelets 254 150 - 400 K/uL   Neutrophils Relative % 68 43 - 77 %   Neutro Abs 6.5 1.7 - 7.7 K/uL   Lymphocytes Relative 21 12 - 46 %   Lymphs Abs 2.0 0.7 - 4.0 K/uL   Monocytes Relative 7 3 - 12 %   Monocytes Absolute 0.7 0.1 - 1.0 K/uL   Eosinophils Relative 4 0 - 5 %   Eosinophils Absolute 0.4 0.0 - 0.7 K/uL   Basophils Relative 0 0 - 1 %   Basophils  Absolute 0.0 0.0 - 0.1 K/uL  hCG, serum, qualitative  Result Value Ref Range   Preg, Serum NEGATIVE NEGATIVE  Protime-INR  Result Value Ref Range   Prothrombin Time 13.4 11.6 - 15.2 seconds   INR 1.00 0.00 - 1.49    Treatments: Bilateral uterine artery embolization 07/10/14   Discharge Exam: Blood pressure 98/47, pulse 76, temperature 98.1 F (36.7 C), temperature source Oral, resp. rate 14, height 5\' 4"  (1.626 m), weight 152 lb 14.4 oz (69.355 kg), last menstrual period 06/26/2014, SpO2 96 %.  Awake and Alert Comfortable. Pain controlled with PO meds. Abdomen soft, lower abdomen is mildly tender to palpation. Lungs CTA Heart RRR  Right groin stick ok, no hematoma or pseudoaneurysm   Disposition: Home  Discharge Instructions    Call MD for:  difficulty breathing, headache or visual disturbances    Complete by:  As directed      Call MD for:  extreme fatigue    Complete by:  As directed      Call MD for:  hives    Complete by:  As directed      Call MD for:  persistant dizziness or light-headedness    Complete by:  As directed      Call MD for:  persistant nausea and vomiting    Complete by:  As directed      Call MD for:  redness, tenderness, or signs of infection (pain, swelling, redness, odor or green/yellow discharge around incision site)    Complete by:  As directed      Call MD for:  severe uncontrolled pain    Complete by:  As directed      Call MD for:  temperature >100.4    Complete by:  As directed      Diet - low sodium heart healthy    Complete by:  As directed      Driving Restrictions    Complete by:  As directed   No driving for 48 hours     Increase activity slowly    Complete by:  As directed      Lifting restrictions    Complete by:  As directed   No heavy lifting for 3-4 days     May shower / Bathe    Complete by:  As directed      May walk up steps    Complete by:  As directed      Remove dressing in 24 hours    Complete by:  As directed       Sexual Activity Restrictions    Complete by:  As directed   No sexual intercourse for 1 week            Medication List    STOP taking these medications        amoxicillin 875 MG tablet  Commonly known as:  AMOXIL     azelastine 0.1 % nasal spray  Commonly known as:  ASTELIN     cetirizine 10 MG tablet  Commonly known as:  ZYRTEC     meclizine 12.5 MG tablet  Commonly known as:  ANTIVERT      TAKE these medications        montelukast 10 MG tablet  Commonly known as:  SINGULAIR  Take 10 mg by mouth at bedtime.     norethindrone-ethinyl estradiol 1/35 tablet  Commonly known as:  ORTHO-NOVUM, NORTREL,CYCLAFEM  Take 1 tablet by mouth daily.     valACYclovir 1000 MG tablet  Commonly known as:  VALTREX  TAKE 2 TABLETS AT OUTBREAK ONSET THEN REPEAT IN 12 HOURS FOR EACH OUTBREAK.      Ibuprofen 600 mg tablet Take one by mouth every 6 hours with food for 5 days (Rx given to patient)  Zofran 8 mg tablet #10 Take one by mouth every 12 hours as needed for nausea (Rx given to patient)  Vicodin 5/325 mg tablet #20 Take one by mouth every 6 hours as needed for pain (Rx given to patient)  Colace 100 mg tablet #20 Take one by mouth twice daily as needed for constipation (Rx given to patient)       Follow-up Information    Follow up with HASSELL Richards, Erica DANIEL, MD In 4 weeks.   Specialty:  Interventional Radiology   Why:  Office will call with appt in 4 weeks. Call [phone] or [phone] with any questions   Contact information:   [address] [id number] [phone]        Signed: Nevin Richards 07/11/2014, 12:51 PM   I have spent Less Than 30 Minutes discharging Erica Pena  Richards.

## 2014-07-11 NOTE — Discharge Instructions (Signed)
Uterine Artery Embolization for Fibroids, Care After Refer to this sheet in the next few weeks. These instructions provide you with information on caring for yourself after your procedure. Your health care provider may also give you more specific instructions. Your treatment has been planned according to current medical practices, but problems sometimes occur. Call your health care provider if you have any problems or questions after your procedure. WHAT TO EXPECT AFTER THE PROCEDURE After your procedure, it is typical to have cramping in the pelvis. You will be given pain medicine to control it. HOME CARE INSTRUCTIONS  Only take over-the-counter or prescription medicines for pain, discomfort, or fever as directed by your health care provider.  Do not take aspirin. It can cause bleeding.  Follow your health care provider's advice regarding medicines given to you, diet, activity, and when to begin sexual activity.  See your health care provider for follow-up care as directed. SEEK MEDICAL CARE IF:  You have a fever.  You have redness, swelling, and pain around your incision site.  You have pus draining from your incision.  You have a rash. SEEK IMMEDIATE MEDICAL CARE IF:  You have bleeding from your incision site.  You have difficulty breathing.  You have chest pain.  You have severe abdominal pain.  You have leg pain.  You become dizzy and faint. Document Released: 11/16/2012 Document Reviewed: 11/16/2012 [facility] Patient Information 2015 [place], [place]. This information is not intended to replace advice given to you by your health care provider. Make sure you discuss any questions you have with your health care provider.

## 2014-07-11 NOTE — Progress Notes (Signed)
Patient d/c home. Stable. 

## 2014-07-12 ENCOUNTER — Other Ambulatory Visit (HOSPITAL_COMMUNITY): Payer: Self-pay | Admitting: Interventional Radiology

## 2014-07-12 DIAGNOSIS — D259 Leiomyoma of uterus, unspecified: Secondary | ICD-10-CM

## 2014-08-07 ENCOUNTER — Ambulatory Visit
Admission: RE | Admit: 2014-08-07 | Discharge: 2014-08-07 | Disposition: A | Discharge status: Home / Self Care | Payer: PRIVATE HEALTH INSURANCE | Source: Ambulatory Visit | Attending: Interventional Radiology | Admitting: Interventional Radiology

## 2014-08-07 DIAGNOSIS — D259 Leiomyoma of uterus, unspecified: Secondary | ICD-10-CM

## 2014-08-07 NOTE — Consult Note (Signed)
     Subjective:  [name] was seen today for scheduled appointment 1 month post uterine fibroid embolization on 07/10/2014. She did well post procedure and went home the next day, her pain and other post embolization  symptoms adequately controlled with the prescribed medications. She has returned to full activity. However, she describes malodorous vaginal discharge which started soon after she returned home. She's also had intermittent vaginal bleeding. Today she's having moderately severe pelvic pain which is new. She tried some ibuprofen earlier today but this was inadequate to relieve her symptoms. She is not happy.  Allergies: Augmentin and Sulfa antibiotics  Medications: Prior to Admission medications   Medication Sig Start Date End Date Taking? Authorizing Provider  montelukast (SINGULAIR) 10 MG tablet Take 10 mg by mouth at bedtime.    Historical Provider, MD  norethindrone-ethinyl estradiol 1/35 (ORTHO-NOVUM, NORTREL,CYCLAFEM) tablet Take 1 tablet by mouth daily.    Historical Provider, MD  valACYclovir (VALTREX) 1000 MG tablet TAKE 2 TABLETS AT OUTBREAK ONSET THEN REPEAT IN 12 HOURS FOR EACH OUTBREAK.    Mancel Bale, PA-C     Vital Signs: BP 102/55 mmHg  Pulse 68  Temp(Src) 98.1 F (36.7 C) (Oral)  Resp 15  SpO2 100%  LMP 07/24/2014  Physical Exam  Constitutional: She appears well-developed and well-nourished. She appears distressed.  Pulmonary/Chest: Effort normal. No respiratory distress.  Abdominal: She exhibits no distension.  Skin: Skin is warm and dry. She is not diaphoretic. No pallor.  Psychiatric: Her behavior is normal. Judgment and thought content normal.  Vitals reviewed.   Imaging: No results found.  Labs:  CBC:  Recent Labs  07/10/14 0820  WBC 9.5  HGB 12.4  HCT 38.0  PLT 254    COAGS:  Recent Labs  07/10/14 0820  INR 1.00    BMP:  Recent Labs  07/10/14 0820  NA 138  K 3.6  CL 107  CO2 22  GLUCOSE 96  BUN 16  CALCIUM  8.8*  CREATININE 0.93  GFRNONAA >60  GFRAA >60    LIVER FUNCTION TESTS: No results for input(s): BILITOT, AST, ALT, ALKPHOS, PROT, ALBUMIN in the last 8760 hours.  Assessment and Plan:  My impression is that [name] is not recovering as expected post fibroid embolization. The malodorous discharge is worrisome for submucosal fibroid communication with endometrial cavity and passage of devitalized material. This puts her at increased risk for superimposed infection. Aside from today's pain which is new, she   has no  fever or chills or other systemic evidence of infection.  I telephoned Dr. Lynnette Richards and reviewed the findings. She is agreeable to see [name] this afternoon at 2 PM for evaluation for possible D&C. [name] declined narcotic strength pain medication to control the pain  in the interim. She is agreeable see Dr. Lynnette Richards. We'll follow-up after that visit to make sure her recovery gets back on track.  Signed: Amesha Richards III, DAYNE Liddy Deam 08/07/2014, 10:15 AM   I spent a total of 15 Minutes in face to face in clinical consultation/evaluation, greater than 50% of which was counseling/coordinating care for vaginal discharge and pain post uterine fibroid embolization.

## 2014-10-02 ENCOUNTER — Telehealth: Payer: Self-pay | Admitting: Radiology

## 2014-10-02 NOTE — Telephone Encounter (Signed)
Left message requesting patient call for status update 3 mo post [place].  Novali Vollman Riki Rusk, RN 10/02/2014 11:56 AM

## 2014-12-04 ENCOUNTER — Other Ambulatory Visit (HOSPITAL_COMMUNITY): Payer: Self-pay | Admitting: Interventional Radiology

## 2014-12-04 DIAGNOSIS — D259 Leiomyoma of uterus, unspecified: Secondary | ICD-10-CM

## 2014-12-05 ENCOUNTER — Other Ambulatory Visit: Payer: Self-pay | Admitting: Obstetrics & Gynecology

## 2014-12-05 DIAGNOSIS — D259 Leiomyoma of uterus, unspecified: Secondary | ICD-10-CM

## 2014-12-31 ENCOUNTER — Ambulatory Visit (INDEPENDENT_AMBULATORY_CARE_PROVIDER_SITE_OTHER): Payer: PRIVATE HEALTH INSURANCE | Admitting: Physician Assistant

## 2014-12-31 VITALS — BP 112/64 | HR 57 | Temp 98.2°F | Resp 14 | Ht 64.5 in | Wt 155.2 lb

## 2014-12-31 DIAGNOSIS — J3489 Other specified disorders of nose and nasal sinuses: Secondary | ICD-10-CM

## 2014-12-31 MED ORDER — PREDNISONE 20 MG PO TABS: 40.0000 mg | ORAL_TABLET | Freq: Every day | ORAL | Status: DC
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
## 2015-01-09 ENCOUNTER — Ambulatory Visit
Admission: RE | Admit: 2015-01-09 | Discharge: 2015-01-09 | Disposition: A | Discharge status: Home / Self Care | Payer: PRIVATE HEALTH INSURANCE | Source: Ambulatory Visit | Attending: Obstetrics & Gynecology | Admitting: Obstetrics & Gynecology

## 2015-01-09 ENCOUNTER — Ambulatory Visit (INDEPENDENT_AMBULATORY_CARE_PROVIDER_SITE_OTHER): Payer: PRIVATE HEALTH INSURANCE

## 2015-01-09 ENCOUNTER — Ambulatory Visit
Admission: RE | Admit: 2015-01-09 | Discharge: 2015-01-09 | Disposition: A | Discharge status: Home / Self Care | Payer: PRIVATE HEALTH INSURANCE | Source: Ambulatory Visit | Attending: Interventional Radiology | Admitting: Interventional Radiology

## 2015-01-09 ENCOUNTER — Ambulatory Visit (INDEPENDENT_AMBULATORY_CARE_PROVIDER_SITE_OTHER): Payer: PRIVATE HEALTH INSURANCE | Admitting: Emergency Medicine

## 2015-01-09 VITALS — BP 122/78 | HR 76 | Temp 98.4°F | Resp 16 | Ht 64.5 in | Wt 154.0 lb

## 2015-01-09 DIAGNOSIS — J301 Allergic rhinitis due to pollen: Secondary | ICD-10-CM

## 2015-01-09 DIAGNOSIS — J3489 Other specified disorders of nose and nasal sinuses: Secondary | ICD-10-CM | POA: Diagnosis not present

## 2015-01-09 DIAGNOSIS — H538 Other visual disturbances: Secondary | ICD-10-CM

## 2015-01-09 DIAGNOSIS — D259 Leiomyoma of uterus, unspecified: Secondary | ICD-10-CM

## 2015-01-09 LAB — POCT CBC
Granulocyte percent: 70.3 %G (ref 37–80)
HCT, POC: 38.8 % (ref 37.7–47.9)
Hemoglobin: 13.4 g/dL (ref 12.2–16.2)
Lymph, poc: 1.4 (ref 0.6–3.4)
MCH, POC: 32.3 pg — AB (ref 27–31.2)
MCHC: 34.5 g/dL (ref 31.8–35.4)
MCV: 93.6 fL (ref 80–97)
MID (cbc): 0.8 (ref 0–0.9)
MPV: 7.2 fL (ref 0–99.8)
POC GRANULOCYTE: 5.1 (ref 2–6.9)
POC LYMPH PERCENT: 19 %L (ref 10–50)
POC MID %: 10.7 % (ref 0–12)
Platelet Count, POC: 257 10*3/uL (ref 142–424)
RBC: 4.14 M/uL (ref 4.04–5.48)
RDW, POC: 12.9 %
WBC: 7.3 10*3/uL (ref 4.6–10.2)

## 2015-01-09 LAB — GLUCOSE, POCT (MANUAL RESULT ENTRY): POC GLUCOSE: 107 mg/dL — AB (ref 70–99)

## 2015-01-09 MED ORDER — GADOBENATE DIMEGLUMINE 529 MG/ML IV SOLN
14.0000 mL | Freq: Once | INTRAVENOUS | Status: AC | PRN
  Administered 2015-01-09: 14 mL via INTRAVENOUS

## 2015-01-09 MED ORDER — PREDNISONE 20 MG PO TABS: ORAL_TABLET | ORAL | Status: DC

## 2015-01-09 NOTE — Progress Notes (Signed)
    Referring Physician(s): Hassell,Daniel  Chief Complaint: 6 month follow up uterine fibroid embolization  Subjective: Pt is now 6 months out from bilateral uterine artery/fibroid embolization. She is doing very well from that. All her related symptoms have resolved. She had MRI this morning and is here for review   Allergies: Augmentin and Sulfa antibiotics  Medications: Prior to Admission medications   Medication Sig Start Date End Date Taking? Authorizing Provider  Cetirizine HCl (ZYRTEC ALLERGY PO) Take by mouth daily.   Yes Historical Provider, MD  montelukast (SINGULAIR) 10 MG tablet Take 10 mg by mouth at bedtime.   Yes Historical Provider, MD  norethindrone-ethinyl estradiol 1/35 (ORTHO-NOVUM, NORTREL,CYCLAFEM) tablet Take 1 tablet by mouth daily.   Yes Historical Provider, MD  valACYclovir (VALTREX) 1000 MG tablet TAKE 2 TABLETS AT OUTBREAK ONSET THEN REPEAT IN 12 HOURS FOR EACH OUTBREAK.   Yes Mancel Bale, PA-C  predniSONE (DELTASONE) 20 MG tablet Take 2 tablets (40 mg total) by mouth daily with breakfast. Patient not taking: Reported on 01/09/2015 12/31/14   Mancel Bale, PA-C     Vital Signs: BP 117/65 mmHg  Pulse 83  Temp(Src) 98.6 F (37 C) (Oral)  Resp 15  SpO2 97%  LMP 12/10/2014  Physical Exam  Constitutional: She appears well-developed. No distress.  Cardiovascular: Normal rate and regular rhythm.   Pulmonary/Chest: Effort normal. No respiratory distress.  Abdominal: Soft. She exhibits no mass. There is no tenderness.    Imaging: Mr Pelvis W Wo Contrast  01/09/2015  CLINICAL DATA:  Six-month follow-up for from layering arm over the embolization procedure. EXAM: MRI PELVIS WITHOUT AND WITH CONTRAST TECHNIQUE: Multiplanar multisequence MR imaging of the pelvis was performed both before and after administration of intravenous contrast. CONTRAST:  65mL MULTIHANCE GADOBENATE DIMEGLUMINE 529 MG/ML IV SOLN COMPARISON:  MRI 06/12/2014. FINDINGS: The  uterus measures 8.5 x 7.0 x 6.5 cm. Uterine volume is 178.5 cubic cm. Previous volume was 422.4 cubic cm. The large necrotic fibroid identified on the prior study in the anterior myometrium has completely resolved. The second largest fibroid in the posterior myometrium measures 2.6 x 2.6 x 2.5 cm. The volume is 8.45 cubic cm. It was previously 27.3 cubic cm. After contrast administration none of the fibroids demonstrate enhancement. The endometrium is normal. The cervix is normal. The ovaries are inlet. No pelvic mass or adenopathy. No free pelvic fluid collections. The bladder is normal. No inguinal mass or adenopathy. IMPRESSION: Significant interval decrease in size of the uterus and fibroids as described above. Electronically Signed   By: Marijo Sanes M.D.   On: 01/09/2015 09:46    Assessment and Plan: 6 months s/p (B)[place] MRI reviewed and discussed with pt. Marked interval resolution/reduction in uterine fibroids. Few devitalized fibroids remain, but not likely to contribute to any symptoms and may ultimately continue to regress/resolve. Pt to continue routine Gyn follow up. No further follow up or imaging required from our standpoint  Signed: Ascencion Dike 01/09/2015, 10:46 AM

## 2015-01-09 NOTE — Progress Notes (Addendum)
Patient ID: Erica Richards, female   DOB: May 05, 1965, 49 y.o.   MRN: [id number]     This chart was scribed for Arlyss Queen, MD by Zola Button, Medical Scribe. This patient was seen in room 3 and the patient's care was started at 11:44 AM.   Chief Complaint:  Chief Complaint  Patient presents with  . Nasal Congestion    Onset 1 week  . Cough  . sneezing  . Dental Pain    HPI: Erica Richards is a 49 y.o. female who reports to [facility] today complaining of gradual onset, dry cough that started a few days ago. Patient notes she first began having pressure behind her eyes, which affected her vision, about a week ago. She has since developed sneezing, dry/burning eye pain, loss of taste sensation, congestion, and rhinorrhea. She also reports having facial pain when bending over and a fever at 99.6 F measured last night. She has had difficulty sleeping due to her symptoms. Patient takes Zyrtec and Singulair for allergies and also has Zatidor to use as needed. She has taken saline sprays in the past. She does not take any nasal sprays because they give her headaches. She states she took prednisone a few weeks ago. Patient is also on birth control pills. She denies smoke exposure last week.  Past Medical History  Diagnosis Date  . Asthma   . Allergy   . Headache(784.0)   . Fibroids    No past surgical history on file. Social History   Social History  . Marital Status: Married    Spouse Name: Hassell Done  . Number of Children: 2  . Years of Education: 16   Occupational History  . 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 Smoking status: Former Smoker    Types: Cigarettes    Quit date: 11/11/1994  . Smokeless tobacco: Never Used  . Alcohol Use: No  . Drug Use: No  . Sexual Activity:    Partners: Male    Birth Control/ Protection: Pill   Other Topics Concern  . None   Social History Narrative   Lives with her husband. Older daughter is a full-time Ship broker at  [employer], scheduled to graduate in 01/2012. Younger daughter lives with them part-time, and part-time with her father.   No family history on file. Allergies  Allergen Reactions  . Augmentin [Amoxicillin-Pot Clavulanate] Diarrhea and Nausea Only  . Sulfa Antibiotics Rash   Prior to Admission medications   Medication Sig Start Date End Date Taking? Authorizing Provider  Cetirizine HCl (ZYRTEC ALLERGY PO) Take by mouth daily.   Yes Historical Provider, MD  montelukast (SINGULAIR) 10 MG tablet Take 10 mg by mouth at bedtime.   Yes Historical Provider, MD  norethindrone-ethinyl estradiol 1/35 (ORTHO-NOVUM, NORTREL,CYCLAFEM) tablet Take 1 tablet by mouth daily.   Yes Historical Provider, MD  valACYclovir (VALTREX) 1000 MG tablet TAKE 2 TABLETS AT OUTBREAK ONSET THEN REPEAT IN 12 HOURS FOR EACH OUTBREAK. Patient not taking: Reported on 01/09/2015    Mancel Bale, PA-C     ROS: The patient denies night sweats, unintentional weight loss, chest pain, palpitations, wheezing, dyspnea on exertion, nausea, vomiting, abdominal pain, dysuria, hematuria, melena, numbness, weakness, or tingling.   All other systems have been reviewed and were otherwise negative with the exception of those mentioned in the HPI and as above.    PHYSICAL EXAM: Filed Vitals:   01/09/15 1122  BP: 122/78  Pulse: 76  Temp: 98.4 F (36.9 C)  Resp: 16   Body mass index is 26.04 kg/(m^2).   General: Alert, no acute distress HEENT:  Normocephalic, atraumatic, oropharynx patent. TMs clear. Turbinates blue and swollen. Throat normal. Eye: Conjunctiva injected. Cardiovascular:  Regular rate and rhythm, no rubs murmurs or gallops.  No Carotid bruits, radial pulse intact. No pedal edema.  Respiratory: Clear to auscultation bilaterally.  No wheezes, rales, or rhonchi.  No cyanosis, no use of accessory musculature Abdominal: No organomegaly, abdomen is soft and non-tender, positive bowel sounds.  No  masses. Musculoskeletal: Gait intact. No edema, tenderness Skin: No rashes. Neurologic: Facial musculature symmetric. Psychiatric: Patient acts appropriately throughout our interaction. Lymphatic: No cervical or submandibular lymphadenopathy     LABS: Results for orders placed or performed in visit on 01/09/15  POCT glucose (manual entry)  Result Value Ref Range   POC Glucose 107 (A) 70 - 99 mg/dl    Results for orders placed or performed in visit on 01/09/15  POCT glucose (manual entry)  Result Value Ref Range   POC Glucose 107 (A) 70 - 99 mg/dl  POCT CBC  Result Value Ref Range   WBC 7.3 4.6 - 10.2 K/uL   Lymph, poc 1.4 0.6 - 3.4   POC LYMPH PERCENT 19.0 10 - 50 %L   MID (cbc) 0.8 0 - 0.9   POC MID % 10.7 0 - 12 %M   POC Granulocyte 5.1 2 - 6.9   Granulocyte percent 70.3 37 - 80 %G   RBC 4.14 4.04 - 5.48 M/uL   Hemoglobin 13.4 12.2 - 16.2 g/dL   HCT, POC 38.8 37.7 - 47.9 %   MCV 93.6 80 - 97 fL   MCH, POC 32.3 (A) 27 - 31.2 pg   MCHC 34.5 31.8 - 35.4 g/dL   RDW, POC 12.9 %   Platelet Count, POC 257 142 - 424 K/uL   MPV 7.2 0 - 99.8 fL   EKG/XRAY:   Primary read interpreted by Dr. Everlene Farrier at [facility] no air-fluid level seen..   ASSESSMENT/PLAN: I suspect this is all allergic. I would recommend she use eyedrops. Apparently she is intolerant to nasal spray. CBC is normal. Will take prednisone 20 mg 2 a day for 5 days. She will try and supplement with some Benadryl. She will continue her Zyrtec and Singulair.I personally performed the services described in this documentation, which was scribed in my presence. The recorded information has been reviewed and is accurate.  By signing my name below, I, Zola Button, attest that this documentation has been prepared under the direction and in the presence of Arlyss Queen, MD.  Electronically Signed: Zola Button, Medical Scribe. 01/09/2015. 11:44 AM.   [place] sideeffects, risk and benefits, and alternatives of medications d/w patient.  Patient is aware that all medications have potential sideeffects and we are unable to predict every sideeffect or drug-drug interaction that may occur.  Arlyss Queen MD 01/09/2015 11:44 AM

## 2015-07-03 ENCOUNTER — Other Ambulatory Visit: Payer: Self-pay | Admitting: Physician Assistant

## 2015-07-23 ENCOUNTER — Ambulatory Visit (INDEPENDENT_AMBULATORY_CARE_PROVIDER_SITE_OTHER): Payer: PRIVATE HEALTH INSURANCE | Admitting: Family Medicine

## 2015-07-23 ENCOUNTER — Encounter: Payer: Self-pay | Admitting: Family Medicine

## 2015-07-23 VITALS — BP 112/78 | HR 62 | Temp 98.2°F | Resp 12 | Ht 64.5 in | Wt 142.9 lb

## 2015-07-23 DIAGNOSIS — Z3041 Encounter for surveillance of contraceptive pills: Secondary | ICD-10-CM | POA: Diagnosis not present

## 2015-07-23 DIAGNOSIS — J309 Allergic rhinitis, unspecified: Secondary | ICD-10-CM | POA: Diagnosis not present

## 2015-07-23 DIAGNOSIS — B001 Herpesviral vesicular dermatitis: Secondary | ICD-10-CM | POA: Diagnosis not present

## 2015-07-23 DIAGNOSIS — N393 Stress incontinence (female) (male): Secondary | ICD-10-CM | POA: Diagnosis not present

## 2015-07-23 LAB — POC URINALSYSI DIPSTICK (AUTOMATED)
BILIRUBIN UA: NEGATIVE
Blood, UA: NEGATIVE
Glucose, UA: NEGATIVE
LEUKOCYTES UA: NEGATIVE
Nitrite, UA: NEGATIVE
PH UA: 5.5
Protein, UA: NEGATIVE
Spec Grav, UA: >=1.03
Urobilinogen, UA: 0.2

## 2015-07-23 MED ORDER — NORETHINDRONE-ETH ESTRADIOL 1-35 MG-MCG PO TABS: 1.0000 | ORAL_TABLET | Freq: Every day | ORAL | Status: DC

## 2015-07-23 MED ORDER — MONTELUKAST SODIUM 10 MG PO TABS: 10.0000 mg | ORAL_TABLET | Freq: Every day | ORAL | Status: DC

## 2015-07-23 MED ORDER — VALACYCLOVIR HCL 1 G PO TABS
ORAL_TABLET | ORAL | Status: DC
Start: 2015-07-23 — End: 2017-04-09

## 2015-07-23 NOTE — Patient Instructions (Addendum)
A few things to remember from today's visit:   1. 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 Recurrent herpes labialis  - valACYclovir (VALTREX) 1000 MG tablet; TAKE 2 TABLETS AT OUTBREAK ONSET THEN REPEAT IN 12 HOURS FOR EACH OUTBREAK.  Dispense: 30 tablet; Refill: 0  3. Surveillance of previously prescribed contraceptive pill  - norethindrone-ethinyl estradiol 1/35 (ORTHO-NOVUM, NORTREL,CYCLAFEM) tablet; Take 1 tablet by mouth daily.  Dispense: 3 Package; Refill: 1  4. Allergic rhinitis, unspecified allergic rhinitis type  - montelukast (SINGULAIR) 10 MG tablet; Take 1 tablet (10 mg total) by mouth at bedtime.  Dispense: 90 tablet; Refill: 2     Rx requested today sent to pharmacy.    Kegel exercise. Avoid constipation.     This exercise helps with mild urine leakage associated with cough, laughing, or sneezing. It may help with other types of urine incontinence and even with mild fecal incontinence.   Tighten and relax the pelvic muscles intermittently during the day. Once you are familiar with exercise try to hold pelvic muscles contraction for about 8-10 seconds.in the beginning you may not be able to hold contraction for more than a second or 2 but eventually you will be able to hold contraction harder and for longer time. Perform  8-12 exercises 3 times per day and daily for 15-20 weeks. You will need to continue exercises indefinitely to have a lasting effect.           If you sign-up for My chart, you can communicate easier with [place] in case you have any question or concern.

## 2015-07-23 NOTE — Progress Notes (Signed)
Pre visit review using our clinic review tool, if applicable. No additional management support is needed unless otherwise documented below in the visit note. 

## 2015-07-23 NOTE — Addendum Note (Signed)
Addended by: Martinique, BETTY G on: 07/23/2015 05:53 PM   Modules accepted: Miquel Dunn

## 2015-07-23 NOTE — Progress Notes (Addendum)
HPI:   [name] is a 50 y.o. female, who is here today to establish care with me.  Former PCP: None. Last preventive routine visit: within a year ago with her gynecologists, she is due in 09/2015.  Hx of Chiari malformation type I, she has followed with neurosurgeon, she has refused surgical treatment.  Concerns today: urine incontinence.For years she has had occasional urine leakage with cough or sneezing but worse after  She had procedure to treat uterine fibroid , about a year ago.   Usually happens when standing up, small leakage and worse with sneezing. Vaginal deliveries  X 2. Hx of constipation, improved with Probiotic.  No dysuria, gross hematuria, frequency or urgency. Denies abdominal pain, nausea, vomiting, changes in bowel habits, blood in stool or melena.  She is going to lose her health insurance, so will not be able to keep her gyn appt, requesting refills on her medications.  Hx of allergic rhinitis, currently she is on Singulair 10 mg daily. Hx of asthma, has not had wheezing or cough in a while.  Recurrent labial herpes: 3-4 times per year, worse with stress. She is on Valtrex, which she has taken as needed upon acute onset. Currently asymptomatic.  She is on OCP's, has taken it for years. Tolerating well with no side effects. LMP 3 weeks ago. Denies any hx of abnormal pap smear and reporting last mammogram in 11/2014, negative.    Review of Systems  Constitutional: Negative for fever, activity change, appetite change and fatigue.  HENT: Positive for mouth sores (3-4 times per year). Negative for nosebleeds, sore throat and trouble swallowing.   Eyes: Negative for redness.  Respiratory: Negative for cough, shortness of breath and wheezing.   Cardiovascular: Negative for chest pain, palpitations and leg swelling.  Gastrointestinal: Negative for nausea, vomiting, abdominal pain and blood in stool.  Genitourinary: Negative for dysuria,  urgency, frequency, hematuria, decreased urine volume, vaginal bleeding, vaginal discharge and menstrual problem.       LMP 3 weeks ago.  Musculoskeletal: Negative for myalgias and back pain.  Skin: Negative for color change and rash.  Neurological: Positive for numbness (occasionally on hands, stable for years.) and headaches (no more than usual). Negative for dizziness, tremors, seizures, syncope, facial asymmetry, speech difficulty and weakness.  Psychiatric/Behavioral: Negative for behavioral problems and confusion. The patient is not nervous/anxious.       Current Outpatient Prescriptions on File Prior to Visit  Medication Sig Dispense Refill  . Cetirizine HCl (ZYRTEC ALLERGY PO) Take by mouth daily.     No current facility-administered medications on file prior to visit.     Past Medical History  Diagnosis Date  . Asthma   . Allergy   . Headache(784.0)   . Fibroids    Allergies  Allergen Reactions  . Augmentin [Amoxicillin-Pot Clavulanate] Diarrhea and Nausea Only  . Sulfa Antibiotics Rash    Family History  Problem Relation Age of Onset  . Healthy Mother     Social History   Social History  . Marital Status: Married    Spouse Name: Hassell Done  . Number of Children: 2  . Years of Education: 16   Occupational History  . 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 Smoking status: Former Smoker    Types: Cigarettes    Quit date: 11/11/1994  . Smokeless tobacco: Never Used  . Alcohol Use: No  . Drug Use: No  .  Sexual Activity:    Partners: Male    Birth Control/ Protection: Pill   Other Topics Concern  . None   Social History Narrative   Lives with her husband. Older daughter is a full-time Ship broker at [employer], scheduled to graduate in 01/2012. Younger daughter lives with them part-time, and part-time with her father.    Filed Vitals:   07/23/15 1416  BP: 112/78  Pulse: 62  Temp: 98.2 F (36.8 C)  Resp: 12     Body mass index is 24.16 kg/(m^2).  SpO2 Readings from Last 3 Encounters:  07/23/15 98%  01/09/15 98%  01/09/15 97%     Physical Exam  Constitutional: She is oriented to person, place, and time. She appears well-developed and well-nourished. No distress.  HENT:  Head: Atraumatic.  Mouth/Throat: Oropharynx is clear and moist and mucous membranes are normal.  Eyes: Conjunctivae and EOM are normal.  Cardiovascular: Normal rate and regular rhythm.   No murmur heard. Pulses:      Dorsalis pedis pulses are 2+ on the right side, and 2+ on the left side.  Respiratory: Effort normal and breath sounds normal. No respiratory distress.  GI: Soft. She exhibits no mass. There is no hepatomegaly. There is no tenderness. There is no CVA tenderness.  Musculoskeletal: She exhibits no edema.  Lymphadenopathy:    She has no cervical adenopathy.  Neurological: She is alert and oriented to person, place, and time. She has normal strength. Coordination and gait normal.  Skin: Skin is warm. No erythema.  Psychiatric: She has a normal mood and affect. Her speech is normal.  Well groomed, good eye contact.      ASSESSMENT AND PLAN:     Erica Richards was seen today for new patient (initial visit) and urinary incontinence.  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 I recommended Kegel exercises and pelvic floor physical therapy exercises. If symptoms are not better or getting worse we may consider urologist referral. Urine dip today negative for blood.  -     POCT Urinalysis Dipstick (Automated)  Recurrent herpes labialis  Stable. No changes in current management. F/U in 12 months.  -     valACYclovir (VALTREX) 1000 MG tablet; TAKE 2 TABLETS AT OUTBREAK ONSET THEN REPEAT IN  12 HOURS FOR EACH OUTBREAK.  Surveillance of previously prescribed contraceptive pill  I agree to refill OCP's, some side effects discussed and she voices understanding. She can continue following a annually. Mammogram to be done in 11/2015.  -     norethindrone-ethinyl estradiol 1/35 (ORTHO-NOVUM, NORTREL,CYCLAFEM) tablet; Take 1 tablet by mouth daily.   Allergic rhinitis, unspecified allergic rhinitis type  Stable. No changes in current management. F/U in 12 months.  -     montelukast (SINGULAIR) 10 MG tablet; Take 1 tablet (10 mg total) by mouth at bedtime.     Colon cancer screening discussed, she prefers to hold on colonoscopy for now due to health insurance but she is hoping to have insurance again in a few months, before she turns 39.    Erica G. Martinique, MD  [facility]. [place] office.

## 2015-11-26 ENCOUNTER — Encounter: Payer: Self-pay | Admitting: Family Medicine

## 2015-11-26 ENCOUNTER — Telehealth: Payer: Self-pay | Admitting: Family Medicine

## 2015-11-26 ENCOUNTER — Ambulatory Visit (INDEPENDENT_AMBULATORY_CARE_PROVIDER_SITE_OTHER): Payer: BLUE CROSS/BLUE SHIELD | Admitting: Family Medicine

## 2015-11-26 VITALS — BP 112/70 | HR 84 | Temp 98.5°F | Resp 12 | Ht 64.5 in | Wt 145.4 lb

## 2015-11-26 DIAGNOSIS — J309 Allergic rhinitis, unspecified: Secondary | ICD-10-CM

## 2015-11-26 DIAGNOSIS — J069 Acute upper respiratory infection, unspecified: Secondary | ICD-10-CM

## 2015-11-26 DIAGNOSIS — J0110 Acute frontal sinusitis, unspecified: Secondary | ICD-10-CM | POA: Diagnosis not present

## 2015-11-26 DIAGNOSIS — H6692 Otitis media, unspecified, left ear: Secondary | ICD-10-CM

## 2015-11-26 MED ORDER — CEFIXIME 400 MG PO CAPS: 400.0000 mg | ORAL_CAPSULE | Freq: Every day | ORAL | 0 refills | Status: DC

## 2015-11-26 MED ORDER — AMOXICILLIN-POT CLAVULANATE 875-125 MG PO TABS: ORAL_TABLET | ORAL | 0 refills | Status: DC

## 2015-11-26 NOTE — Progress Notes (Signed)
HPI:  ACUTE VISIT:  Chief Complaint  Patient presents with  . Sinus Problem    Sinus headache started last week, fever started on thursday, ear ache started yesterday. Last ibuprofen taken last night before bed.     [name] is a 50 y.o. female, who is here today complaining of sinus issues  Complaining of upper respiratory symptoms that he started about 5 days ago, she had 3 days of subjective fever. Yesterday she started with left ear ache, she denies any hearing loss or ear drainage.  Negative for nasal congestion or rhinorrhea, cough, or wheezing  + Mild sore throat, and post nasal drainage.  + Facial pain and frontal headache for a week.  Negative chills or body aches. She has not noted visual changes, nausea, or vomiting.  No Hx of recent travel. Sick contact: No No known insect bite. + Hx of allergies (allergic rhinitis and asthma), currently she is on Singulair 10 mg daily and cetirizine 10 mg daily. She has not tolerated intranasal steroids, because headache.   Medication OTC for this problem: Ibuprofen. Symptoms otherwise stable.    Review of Systems  Constitutional: Positive for fatigue and fever. Negative for activity change and appetite change.  HENT: Positive for ear pain, postnasal drip, sinus pressure and sore throat (mild). Negative for congestion, ear discharge, facial swelling, hearing loss, mouth sores, nosebleeds, rhinorrhea, trouble swallowing and voice change.   Eyes: Negative for pain, discharge, redness and visual disturbance.  Respiratory: Negative for cough, chest tightness, shortness of breath and wheezing.   Gastrointestinal: Negative for abdominal pain, diarrhea, nausea and vomiting.  Musculoskeletal: Negative for myalgias and neck pain.  Skin: Negative for rash.  Allergic/Immunologic: Positive for environmental allergies.  Neurological: Positive for headaches. Negative for syncope, speech difficulty and weakness.    Hematological: Negative for adenopathy. Does not bruise/bleed easily.      Current Outpatient Prescriptions on File Prior to Visit  Medication Sig Dispense Refill  . Cetirizine HCl (ZYRTEC ALLERGY PO) Take by mouth daily.    . montelukast (SINGULAIR) 10 MG tablet Take 1 tablet (10 mg total) by mouth at bedtime. 90 tablet 2  . norethindrone-ethinyl estradiol 1/35 (ORTHO-NOVUM, NORTREL,CYCLAFEM) tablet Take 1 tablet by mouth daily. 3 Package 1  . valACYclovir (VALTREX) 1000 MG tablet TAKE 2 TABLETS AT OUTBREAK ONSET THEN REPEAT IN 12 HOURS FOR EACH OUTBREAK. 30 tablet 0   No current facility-administered medications on file prior to visit.      Past Medical History:  Diagnosis Date  . Allergy   . Asthma   . Fibroids   . Headache(784.0)    Allergies  Allergen Reactions  . Augmentin [Amoxicillin-Pot Clavulanate] Diarrhea and Nausea Only  . Sulfa Antibiotics Rash    Social History   Social History  . Marital status: Married    Spouse name: Hassell Done  . Number of children: 2  . Years of education: 16   Occupational History  . 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 Smoking status: Former Smoker    Types: Cigarettes    Quit date: 11/11/1994  . Smokeless tobacco: Never Used  . Alcohol use No  . Drug use: No  . Sexual activity: Yes    Partners: Male    Birth control/ protection: Pill   Other Topics Concern  . None   Social History Narrative   Lives with her husband. Older daughter is a Charity fundraiser at [employer], scheduled to graduate in 01/2012. Younger daughter lives with them part-time, and part-time with her father.    Vitals:   11/26/15 0939  BP: 112/70  Pulse: 84  Resp: 12  Temp: 98.5 F (36.9 C)   O2 sat 98% at RA.   Body mass index is 24.57 kg/m.     Physical Exam  Nursing note and vitals reviewed. Constitutional: She is oriented to person, place, and time. She appears well-developed and  well-nourished. She does not appear ill. No distress.  HENT:  Head: Atraumatic.  Right Ear: Tympanic membrane, external ear and ear canal normal.  Left Ear: External ear and ear canal normal. Tympanic membrane is erythematous (minimal erythema upper aspect of TM). Tympanic membrane is not bulging.  No middle ear effusion.  Nose: Right sinus exhibits frontal sinus tenderness. Right sinus exhibits no maxillary sinus tenderness. Left sinus exhibits frontal sinus tenderness. Left sinus exhibits no maxillary sinus tenderness.  Mouth/Throat: Oropharynx is clear and moist and mucous membranes are normal.  Eyes: Conjunctivae and EOM are normal.  Cardiovascular: Normal rate and regular rhythm.   No murmur heard. Respiratory: Effort normal and breath sounds normal. No stridor. No respiratory distress.  Lymphadenopathy:       Head (right side): No submandibular adenopathy present.       Head (left side): No submandibular adenopathy present.    She has no cervical adenopathy.  Neurological: She is alert and oriented to person, place, and time. She has normal strength. Coordination and gait normal.  Skin: Skin is warm. No rash noted. No erythema.  Psychiatric: She has a normal mood and affect. Her speech is normal.  Well groomed, good eye contact.      ASSESSMENT AND PLAN:     Triva was seen today for sinus problem.  Diagnoses and all orders for this visit:  URI, acute   Most likely viral. Adequate hydration. Explained that sinus/nasal congestion my last a few weeks after URI. Might develop cough in a few days but should not have fever.   Acute non-recurrent frontal sinusitis  Explained symptoms could be viral etiology, in which case antibiotic treatment will not help. I recommended considering holding on antibiotics for 2-3 days and start if not any better or if symptoms get worse. Some side effects of antibiotics discussed. OTC decongestant may also help. Sinus CT may be necessary  if symptoms persist after treatment.   -     Cefixime (SUPRAX) 400 MG CAPS capsule; Take 1 capsule (400 mg total) by mouth daily.  Acute left otitis media  Minimal erythema. Instructed about warning signs.  Chronic allergic rhinitis, unspecified seasonality, unspecified trigger  Continue OTC cetirizine 10 mg and Singulair 10 mg daily. Nasal irrigations with saline. Steam inhalations might also help.     Return if symptoms worsen or fail to improve.     -Ms.Joye Schneider Glasgow advised to return or notify a doctor immediately if symptoms worsen or persist or new concerns arise.       Caleigha Zale G. Martinique, MD  [facility]. [place] office.

## 2015-11-26 NOTE — Telephone Encounter (Signed)
Called and spoke with patient. She is willing to give Augmentin another chance. Rx sent to pharmacy.

## 2015-11-26 NOTE — Telephone Encounter (Signed)
Options: Augmentin 875-125 mg bid x 10 d (ideal but has caused GI symptoms in the past) OR Doxycycline 100 mg bid with food x 10 days. Thanks, BJ

## 2015-11-26 NOTE — Telephone Encounter (Signed)
Pt seen this am and prescribed  Cefixime (SUPRAX) 400 MG CAPS capsule  It is $90 with insurance.  Pt would like an alternate drug. [facility]

## 2015-11-26 NOTE — Patient Instructions (Addendum)
A few things to remember from today's visit:   URI, acute  Acute non-recurrent frontal sinusitis - Plan: Cefixime (SUPRAX) 400 MG CAPS capsule  Acute left otitis media  Chronic allergic rhinitis, unspecified seasonality, unspecified trigger  Currently viral, in which case antibiotic might help. Steam inhalations, nasal rinses with saline, continue Zyrtec and Singulair.  Please be sure medication list is accurate. If a new problem present, please set up appointment sooner than planned today.

## 2016-03-23 ENCOUNTER — Other Ambulatory Visit: Payer: Self-pay

## 2016-03-23 DIAGNOSIS — Z3041 Encounter for surveillance of contraceptive pills: Secondary | ICD-10-CM

## 2016-03-23 MED ORDER — NORETHINDRONE-ETH ESTRADIOL 1-35 MG-MCG PO TABS: 1.0000 | ORAL_TABLET | Freq: Every day | ORAL | 1 refills | Status: DC

## 2016-05-26 DIAGNOSIS — Z01419 Encounter for gynecological examination (general) (routine) without abnormal findings: Secondary | ICD-10-CM | POA: Diagnosis not present

## 2016-05-26 DIAGNOSIS — Z1231 Encounter for screening mammogram for malignant neoplasm of breast: Secondary | ICD-10-CM | POA: Diagnosis not present

## 2016-05-26 DIAGNOSIS — Z6823 Body mass index (BMI) 23.0-23.9, adult: Secondary | ICD-10-CM | POA: Diagnosis not present

## 2016-07-26 IMAGING — MR MR PELVIS WO/W CM
8 of 10 series · 34 of 48 positions shown · IV contrast (multihance)
Comparison: MRI 06/12/2014.

CLINICAL DATA: Six-month follow-up for from layering arm over the
embolization procedure.

EXAM:
MRI PELVIS WITHOUT AND WITH CONTRAST
TECHNIQUE: Multiplanar multisequence MR imaging of the pelvis was performed
both before and after administration of intravenous contrast.
CONTRAST:  14mL MULTIHANCE GADOBENATE DIMEGLUMINE 529 MG/ML IV SOLN

[Series 2: T2 · coronal · 6.0mm · 1.41mm/px · 6 of 26 slices shown]
[im 1/26]
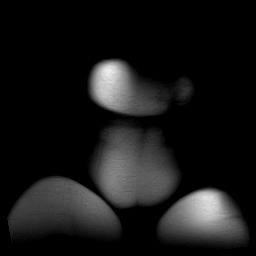
[im 6/26]
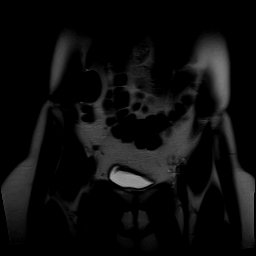
[im 11/26]
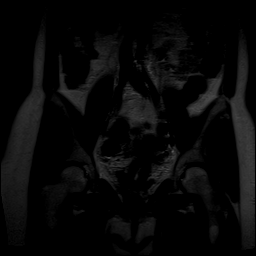
[im 16/26]
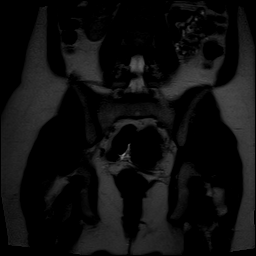
[im 21/26]
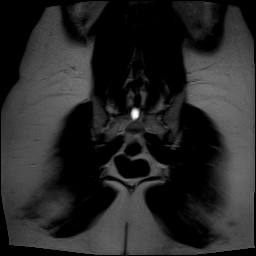
[im 26/26]
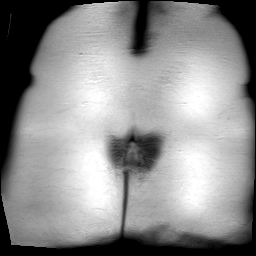

[Series 3: t2_tse axial · axial · 6.0mm · 0.51mm/px · z∈[-88,+85]mm · 4 of 24 slices shown]
[im 1/24]
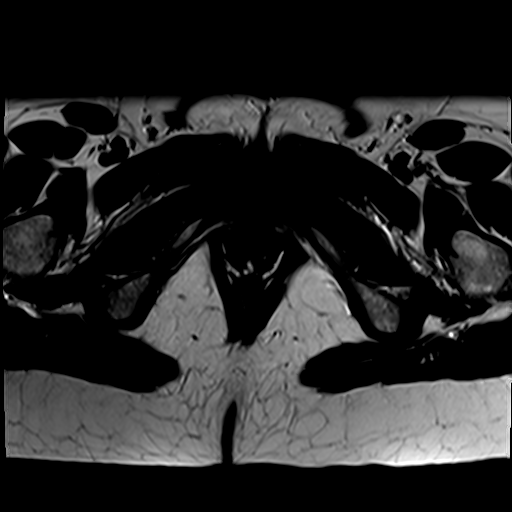
[im 8/24]
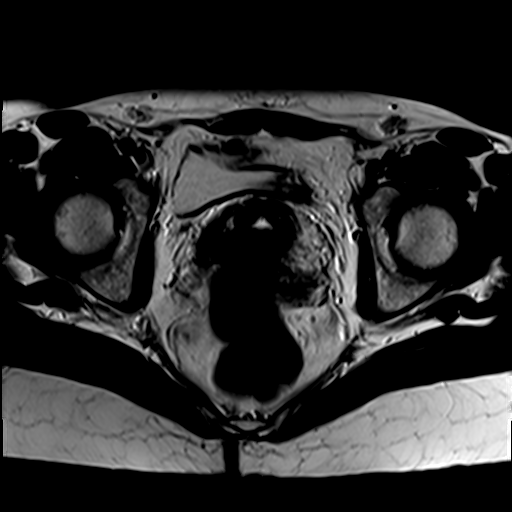
[im 16/24]
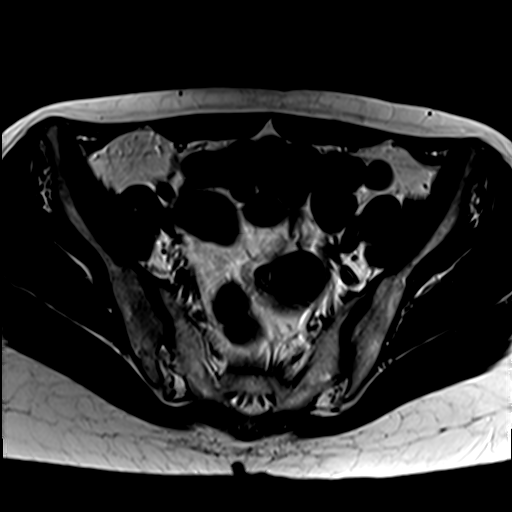
[im 24/24]
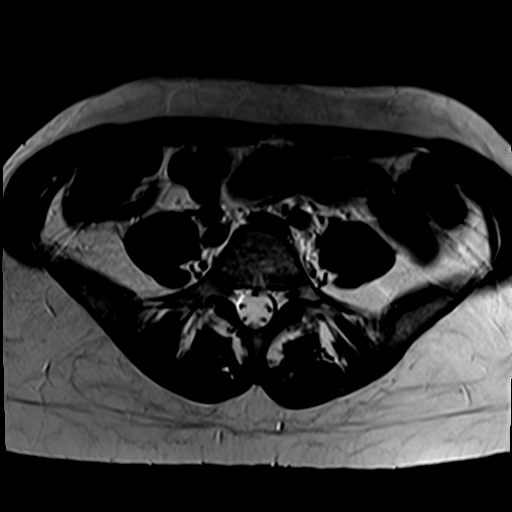

[Series 4: t2_tse axial fs · axial · 6.0mm · 0.51mm/px · z∈[-88,+85]mm · 4 of 24 slices shown]
[im 1/24]
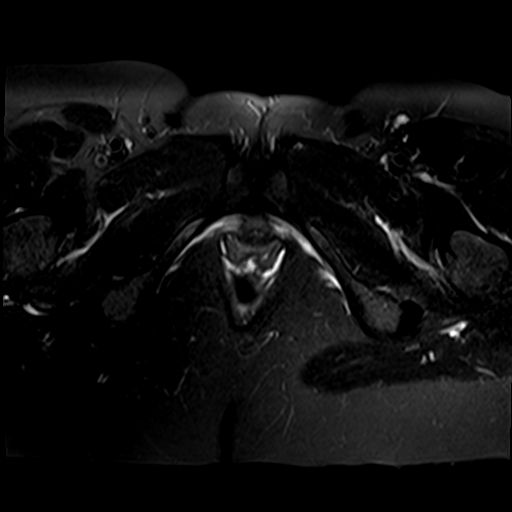
[im 8/24]
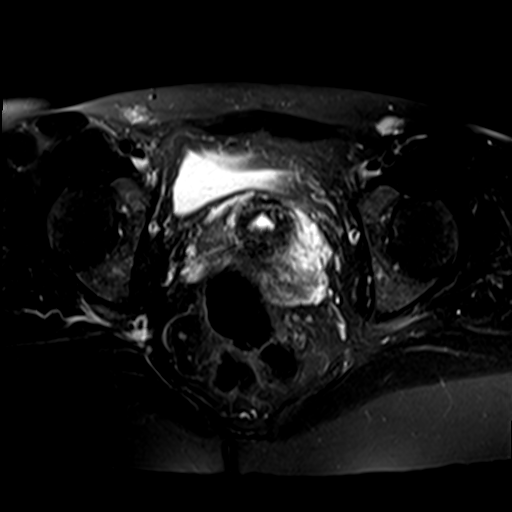
[im 16/24]
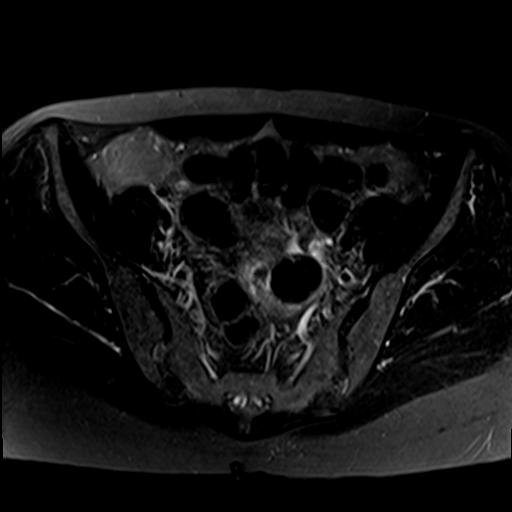
[im 24/24]
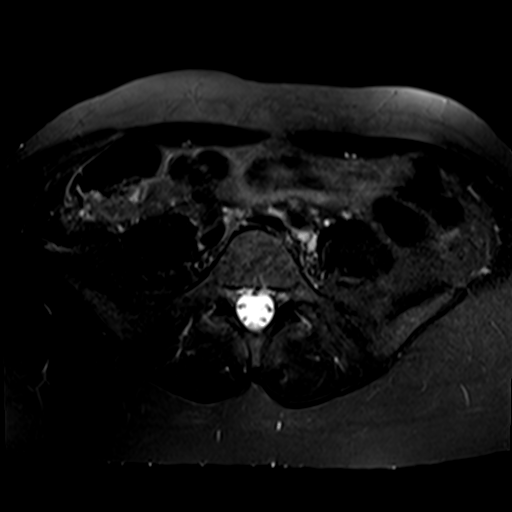

[Series 5: t2_tse_sag · sagittal · 4.0mm · 0.51mm/px · 5 of 28 slices shown]
[im 1/28]
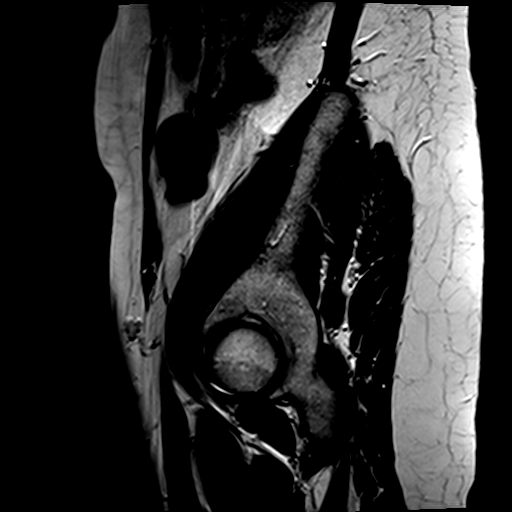
[im 7/28]
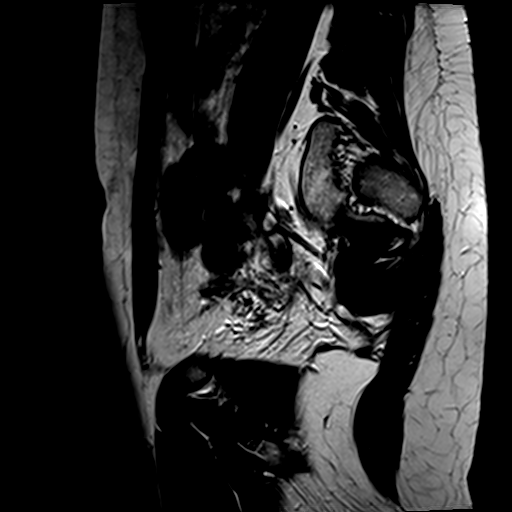
[im 14/28]
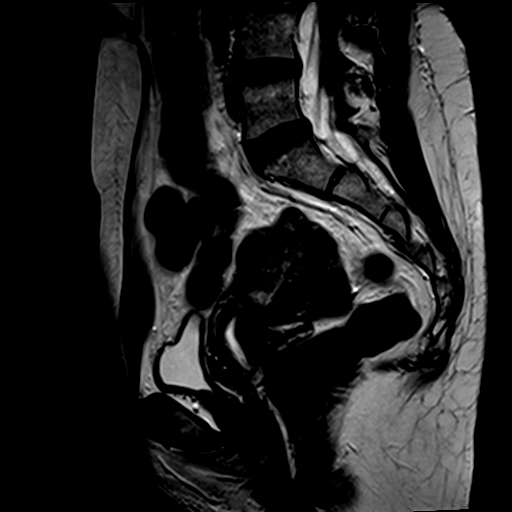
[im 21/28]
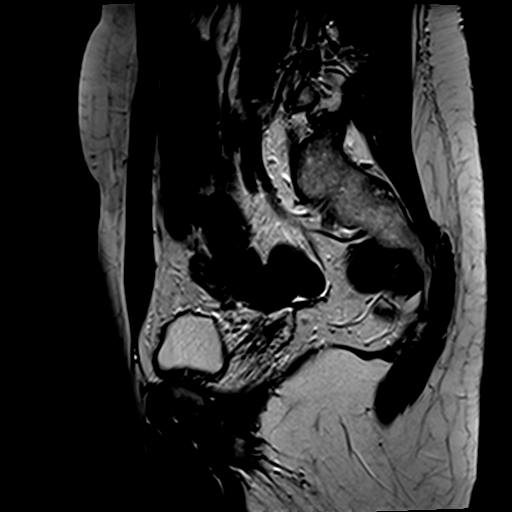
[im 28/28]
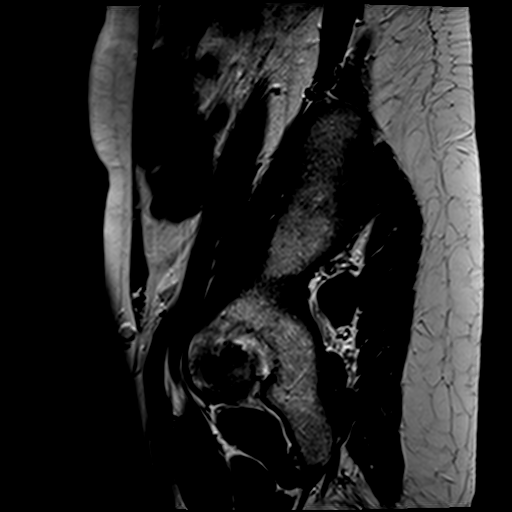

[Series 6: axial spgr-not bh · axial · 6.0mm · 1.02mm/px · z∈[-88,+85]mm · 4 of 24 slices shown]
[im 1/24]
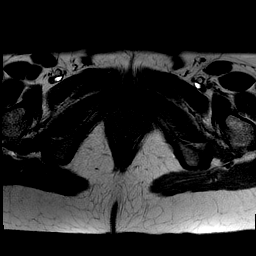
[im 8/24]
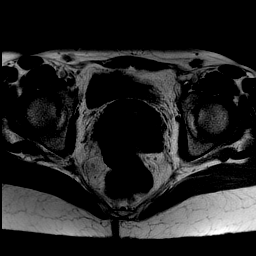
[im 16/24]
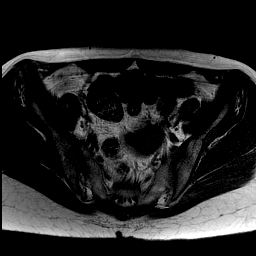
[im 24/24]
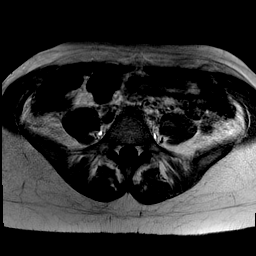

[Series 7: axial spgr fs-pre · axial · non-contrast · 5.0mm · 1.02mm/px · z∈[-86,+83]mm · 5 of 28 slices shown]
[im 1/28]
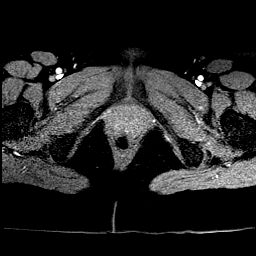
[im 7/28]
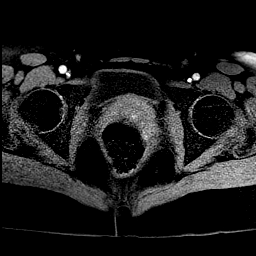
[im 14/28]
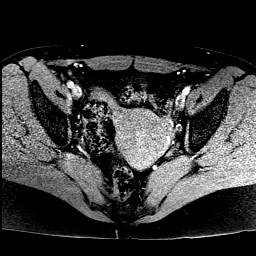
[im 21/28]
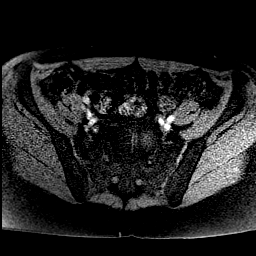
[im 28/28]
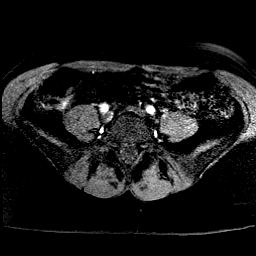

[Series 8: axial spgr fs-post · axial · 5.0mm · 1.02mm/px · z∈[-86,+83]mm · 5 of 28 slices shown (1 of 2)]
[im 1/28]
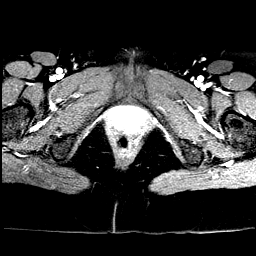
[im 7/28]
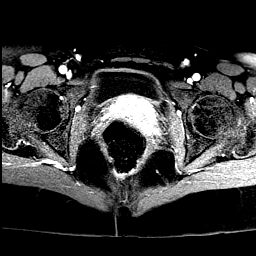
[im 14/28]
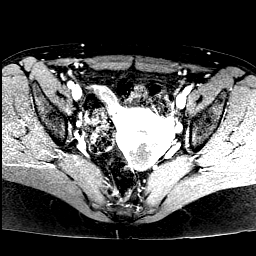
[im 21/28]
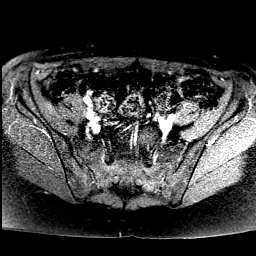
[im 28/28]
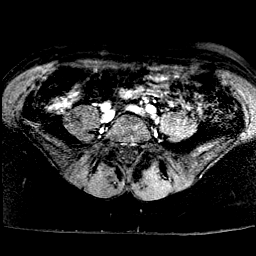

[Series 9: axial spgr fs-post · axial · 5.0mm · 1.02mm/px · 1 of 28 slices shown (2 of 2)]
[im 1/28]
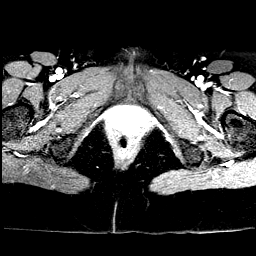

[34 of 48 positions shown; findings below may reference images not displayed]

FINDINGS: The uterus measures 8.5 x 7.0 x 6.5 cm. Uterine volume is
cubic cm. Previous volume was 422.4 cubic cm.

The large necrotic fibroid identified on the prior study in the
anterior myometrium has completely resolved.

The second largest fibroid in the posterior myometrium measures
x 2.6 x 2.5 cm. The volume is 8.45 cubic cm. It was previously
cubic cm.

After contrast administration none of the fibroids demonstrate
enhancement.

The endometrium is normal. The cervix is normal. The ovaries are
inlet. No pelvic mass or adenopathy. No free pelvic fluid
collections. The bladder is normal. No inguinal mass or adenopathy.
IMPRESSION: Significant interval decrease in size of the uterus and fibroids as
described above.

## 2016-10-29 ENCOUNTER — Encounter: Payer: Self-pay | Admitting: Family Medicine

## 2017-01-04 DIAGNOSIS — M545 Low back pain: Secondary | ICD-10-CM | POA: Diagnosis not present

## 2017-01-25 ENCOUNTER — Telehealth: Payer: Self-pay | Admitting: Family Medicine

## 2017-01-25 NOTE — Telephone Encounter (Signed)
Copied from [place] [id number]. Topic: Quick Communication - Rx Refill/Question >> Jan 25, 2017  2:42 PM Carolyn Stare wrote: Has the patient contacted their pharmacy   new refill   valACYclovir (VALTREX) 1000 MG tablet  Preferred Pharmacy (with phone number or street name  [facility]   [phone]   pt phone number     Agent: Please be advised that RX refills may take up to 3 business days. We ask that you follow-up with your pharmacy.

## 2017-01-26 NOTE — Telephone Encounter (Signed)
Patient's LOV with Dr. Martinique is 11-26-15 / No future appointment scheduled / Medication refill for Valtrex

## 2017-01-26 NOTE — Telephone Encounter (Signed)
Routed to Dr. Jordan for review. 

## 2017-01-29 NOTE — Telephone Encounter (Signed)
Informed patient of instructions per Dr. Martinique. Patient verbalized understanding.

## 2017-01-29 NOTE — Telephone Encounter (Signed)
It has been over a year since she was seen. So she needs an appt. Please explain Erica Richards that she needs to follow every 12 months at least in other for [place] to feel comfortable filling Rx's for chronic conditions. If she is seeing gyn, she/he can also refill it if prescribed before and if it has not been over a year.  Thanks, BJ

## 2017-04-09 ENCOUNTER — Ambulatory Visit: Payer: BLUE CROSS/BLUE SHIELD | Admitting: Family Medicine

## 2017-04-09 ENCOUNTER — Encounter: Payer: Self-pay | Admitting: Family Medicine

## 2017-04-09 VITALS — BP 106/70 | HR 63 | Temp 98.2°F | Resp 12 | Ht 64.5 in | Wt 151.4 lb

## 2017-04-09 DIAGNOSIS — J309 Allergic rhinitis, unspecified: Secondary | ICD-10-CM | POA: Diagnosis not present

## 2017-04-09 DIAGNOSIS — B001 Herpesviral vesicular dermatitis: Secondary | ICD-10-CM | POA: Diagnosis not present

## 2017-04-09 DIAGNOSIS — J0100 Acute maxillary sinusitis, unspecified: Secondary | ICD-10-CM | POA: Diagnosis not present

## 2017-04-09 DIAGNOSIS — H1012 Acute atopic conjunctivitis, left eye: Secondary | ICD-10-CM | POA: Diagnosis not present

## 2017-04-09 MED ORDER — DOXYCYCLINE HYCLATE 100 MG PO TABS: 100.0000 mg | ORAL_TABLET | Freq: Two times a day (BID) | ORAL | 0 refills | Status: AC

## 2017-04-09 MED ORDER — FLUTICASONE PROPIONATE 50 MCG/ACT NA SUSP: 1.0000 | Freq: Two times a day (BID) | NASAL | 3 refills | Status: AC

## 2017-04-09 MED ORDER — OLOPATADINE HCL 0.7 % OP SOLN: 1.0000 [drp] | Freq: Every day | OPHTHALMIC | 2 refills | Status: DC

## 2017-04-09 MED ORDER — VALACYCLOVIR HCL 1 G PO TABS: ORAL_TABLET | ORAL | 0 refills | Status: AC

## 2017-04-09 MED ORDER — PREDNISONE 20 MG PO TABS: 40.0000 mg | ORAL_TABLET | Freq: Every day | ORAL | 0 refills | Status: AC

## 2017-04-09 NOTE — Patient Instructions (Signed)
A few things to remember from today's visit:   Acute non-recurrent maxillary sinusitis - Plan: doxycycline (VIBRA-TABS) 100 MG tablet, predniSONE (DELTASONE) 20 MG tablet, fluticasone (FLONASE) 50 MCG/ACT nasal spray  Recurrent herpes labialis - Plan: valACYclovir (VALTREX) 1000 MG tablet  Allergic conjunctivitis of left eye - Plan: Olopatadine HCl (PAZEO) 0.7 % SOLN  Allergic rhinitis, unspecified seasonality, unspecified trigger  There are 2 forms of allergic rhinitis: . Seasonal (hay fever): Caused by an allergy to pollen and/or mold spores in the air. Pollen is the fine powder that comes from the stamen of flowering plants. It can be carried through the air and is easily inhaled. Symptoms are seasonal and usually occur in spring, late summer, and fall. Marland Kitchen Perennial: Caused by other allergens such as dust mites, pet hair or dander, or mold. Symptoms occur year-round.  Symptoms: Your symptoms can vary, depending on the severity of your allergies. Symptoms can include: Sneezing, coughing.itching (mostly eyes, nose, mouth, throat and skin),runny nose,stuffy nose.headache,pressure in the nose and cheeks,ear fullness and popping, sore throat.watery, red, or swollen eyes,dark circles under your eyes,trouble smelling, and sometimes hives.  Allergic rhinitis cannot be prevented. You can help your symptoms by avoiding the things that you are allergic, including: . Keeping windows closed. This is especially important during high-pollen seasons. . Washing your hands after petting animals. . Using dust- and mite-proof bedding and mattress covers. . Wearing glasses outside to protect your eyes. . Showering before bed to wash off allergens from hair and skin. You can also avoid things that can make your symptoms worse, such as: . aerosol sprays . air pollution . cold temperatures . humidity . irritating fumes . tobacco smoke . wind . wood smoke.   Antihistamines help reduce the sneezing, runny  nose, and itchiness of allergies. These come in pill form and as nasal sprays. Allegra,Zyrtec,or Claritin are some examples. Decongestants, such as pseudoephedrine and phenylephrine, help temporarily relieve the stuffy nose of allergies. Decongestants are found in many medicines and come as pills, nose sprays, and nose drops. They could increase heart rate and cause tachycardia and tremor. Nasal Afrin should not be used for more than 3 days because you can become dependent on them. This causes you to feel even more stopped-up when you try to quit using them.  Nasal sprays: steroids or antihistaminics. Over the counter intranasal sterids: Nasocort,Rhinocort,or Flonase.You won't notice their benefits for up to 2 weeks after starting them. Allergy shots or sublingual tablets when other treatment do not help.This is done by immunologists.   Please be sure medication list is accurate. If a new problem present, please set up appointment sooner than planned today.

## 2017-04-09 NOTE — Progress Notes (Signed)
ACUTE VISIT  HPI:  Chief Complaint  Patient presents with  . Sinusitis  . Eye Drainage    left eye, swollen this morning, some redness    [name] is a 52 y.o.female here today complaining of 2 days of respiratory symptoms.  She thinks she has a sinus infection. + "Thick" post nasal drainage and right facial pressure pain.   HPI  Scratchy throat and burning nose sensation. Right "ear fluid" she can hear. She has not noted fever or chills.     Yesterday she noted left eye clear drainage in eye lashes and today mild periocular edema,resolved. She denies visual changes, nausea,or vomiting.   No Hx of recent travel. No sick contact. No known insect bite.  Hx of allergies: Yes, allergic rhinitis.  OTC medications for this problem: OTC ear drops  Symptoms otherwise stable.  She is also requesting refills on Valtrex. Hx of cold sores, she has had 2 episodes in the past 2 months. It is exacerbated by stress.  Valtrex is "very" effective. She has flare up about every 2 months. Tolerating medication well,no side effects.    Review of Systems  Constitutional: Positive for fatigue. Negative for activity change, appetite change and fever.  HENT: Positive for congestion, mouth sores, postnasal drip, rhinorrhea and sinus pain. Negative for ear pain, trouble swallowing and voice change.   Eyes: Positive for discharge. Negative for photophobia, pain, redness, itching and visual disturbance.  Respiratory: Negative for cough, shortness of breath and wheezing.   Gastrointestinal: Negative for abdominal pain, diarrhea, nausea and vomiting.  Musculoskeletal: Negative for myalgias and neck pain.  Skin: Negative for rash.  Allergic/Immunologic: Positive for environmental allergies.  Neurological: Negative for syncope, weakness and headaches.  Hematological: Negative for adenopathy. Does not bruise/bleed easily.  Psychiatric/Behavioral: Negative for  confusion. The patient is nervous/anxious.       Current Outpatient Medications on File Prior to Visit  Medication Sig Dispense Refill  . Cetirizine HCl (ZYRTEC ALLERGY PO) Take by mouth daily.    . montelukast (SINGULAIR) 10 MG tablet Take 1 tablet (10 mg total) by mouth at bedtime. 90 tablet 2  . norethindrone-ethinyl estradiol 1/35 (ORTHO-NOVUM, NORTREL,CYCLAFEM) tablet Take 1 tablet by mouth daily. 3 Package 1   No current facility-administered medications on file prior to visit.      Past Medical History:  Diagnosis Date  . Allergy   . Asthma   . Fibroids   . Headache(784.0)    Allergies  Allergen Reactions  . Augmentin [Amoxicillin-Pot Clavulanate] Diarrhea and Nausea Only  . Sulfa Antibiotics Rash    Social History   Socioeconomic History  . Marital status: Married    Spouse name: Erica Richards  . Number of children: 2  . Years of education: 74  . Highest education level: None  Social Needs  . Financial resource strain: None  . Food insecurity - worry: None  . Food insecurity - inability: None  . Transportation needs - medical: None  . Transportation needs - non-medical: None  Occupational History  . Occupation: Therapist, art Rep    CommentTourist information centre manager  Tobacco Use  . Smoking status: Former Smoker    Types: Cigarettes    Last attempt to quit: 11/11/1994    Years since quitting: 22.4  . Smokeless tobacco: Never Used  Substance and Sexual Activity  . Alcohol use: No  . Drug use: No  . Sexual activity: Yes    Partners: Male    Birth  control/protection: Pill  Other Topics Concern  . None  Social History Narrative   Lives with her husband. Older daughter is a full-time Ship broker at [employer], scheduled to graduate in 01/2012. Younger daughter lives with them part-time, and part-time with her father.    Vitals:   04/09/17 1109  BP: 106/70  Pulse: 63  Resp: 12  Temp: 98.2 F (36.8 C)  SpO2: 100%   Body mass index is 25.58  kg/m.   Physical Exam  Nursing note and vitals reviewed. Constitutional: She is oriented to person, place, and time. She appears well-developed. She does not appear ill. No distress.  HENT:  Head: Normocephalic and atraumatic.  Right Ear: External ear and ear canal normal. Tympanic membrane is bulging. Tympanic membrane is not erythematous. A middle ear effusion is present.  Left Ear: Tympanic membrane, external ear and ear canal normal.  Nose: Rhinorrhea present. Right sinus exhibits maxillary sinus tenderness (Mild). Right sinus exhibits no frontal sinus tenderness. Left sinus exhibits no maxillary sinus tenderness and no frontal sinus tenderness.  Mouth/Throat: Oropharynx is clear and moist and mucous membranes are normal.  Eyes: Conjunctivae are normal. Pupils are equal, round, and reactive to light.  Neck: No muscular tenderness present. No edema and no erythema present.  Cardiovascular: Normal rate and regular rhythm.  No murmur heard. Respiratory: Effort normal and breath sounds normal. No stridor. No respiratory distress.  Musculoskeletal: She exhibits no edema or tenderness.  Lymphadenopathy:    She has no cervical adenopathy.  Neurological: She is alert and oriented to person, place, and time. She has normal strength.  Skin: Skin is warm. No rash noted. No erythema.  Psychiatric: Her mood appears anxious.  Well groomed, good eye contact.    ASSESSMENT AND PLAN:   [name] was seen today for sinusitis and eye drainage.  Diagnoses and all orders for this visit:  Acute non-recurrent maxillary sinusitis  Explained that symptoms could be allergy or viral related. Abx side effects discussed. I recommend holding 2-3 days before starting abx. May consider sinus imaging if symptoms persist.  -     doxycycline (VIBRA-TABS) 100 MG tablet; Take 1 tablet (100 mg total) by mouth 2 (two) times daily for 7 days. -     predniSONE (DELTASONE) 20 MG tablet; Take 2 tablets (40 mg  total) by mouth daily with breakfast for 3 days.  Recurrent herpes labialis  Stable. No changes in current management. F/U in 12 months.  -     valACYclovir (VALTREX) 1000 MG tablet; TAKE 2 TABLETS AT OUTBREAK ONSET THEN REPEAT IN 12 HOURS FOR EACH OUTBREAK.  Allergic conjunctivitis of left eye  No signs of bacterial infection. Instructed about warning signs.  -     Olopatadine HCl (PAZEO) 0.7 % SOLN; Apply 1 drop to eye daily.  Allergic rhinitis, unspecified seasonality, unspecified trigger  Prednisone may help. Flonase nasal spray,nasal irrigations with saline,and steam inhalations may help.  -     fluticasone (FLONASE) 50 MCG/ACT nasal spray; Place 1 spray into both nostrils 2 (two) times daily.      -[name] was advised to seek attention immediately if symptoms worsen or to follow if they persist or new concerns arise.       Erica Richards G. Martinique, MD  [facility]. [place] office.

## 2017-04-13 DIAGNOSIS — H9201 Otalgia, right ear: Secondary | ICD-10-CM | POA: Diagnosis not present

## 2017-05-31 DIAGNOSIS — Z6824 Body mass index (BMI) 24.0-24.9, adult: Secondary | ICD-10-CM | POA: Diagnosis not present

## 2017-05-31 DIAGNOSIS — Z01419 Encounter for gynecological examination (general) (routine) without abnormal findings: Secondary | ICD-10-CM | POA: Diagnosis not present

## 2017-07-29 DIAGNOSIS — N951 Menopausal and female climacteric states: Secondary | ICD-10-CM | POA: Diagnosis not present

## 2023-01-05 ENCOUNTER — Other Ambulatory Visit: Payer: Self-pay | Admitting: Obstetrics & Gynecology

## 2023-01-05 DIAGNOSIS — R928 Other abnormal and inconclusive findings on diagnostic imaging of breast: Secondary | ICD-10-CM

## 2023-01-22 ENCOUNTER — Other Ambulatory Visit: Payer: Self-pay

## 2023-01-22 ENCOUNTER — Telehealth: Payer: Self-pay

## 2023-01-22 DIAGNOSIS — Z1211 Encounter for screening for malignant neoplasm of colon: Secondary | ICD-10-CM

## 2023-01-22 NOTE — Telephone Encounter (Signed)
Pt scheduled for previsit 02/25/23 at 3:30pm for telephone visit. Pt aware. Colon scheduled at [facility] 03/24/23 at 10am, arrival time at 8:30am. [phone]. Pt aware of appts.

## 2023-01-22 NOTE — Telephone Encounter (Signed)
Pre-visit RN was preparing patient chart for colonoscopy and saw that the patient has Chiari malformation and will need to be scheduled at the hospital. Pre-vist RN will notify Dr. Lamar Sprinkles RN to contact patient.

## 2023-01-22 NOTE — Telephone Encounter (Deleted)
RN preparing patient chart for upcoming colonoscopy; Chiari malformation noted on patient history. RN to contact CRNA, Azucena Kuba regarding patient needing to have procedure completed at hospital.

## 2023-01-28 ENCOUNTER — Ambulatory Visit
Admission: RE | Admit: 2023-01-28 | Discharge: 2023-01-28 | Disposition: A | Discharge status: Home / Self Care | Payer: BC Managed Care – PPO | Source: Ambulatory Visit | Attending: Obstetrics & Gynecology | Admitting: Obstetrics & Gynecology

## 2023-01-28 ENCOUNTER — Ambulatory Visit: Payer: BLUE CROSS/BLUE SHIELD

## 2023-01-28 DIAGNOSIS — R928 Other abnormal and inconclusive findings on diagnostic imaging of breast: Secondary | ICD-10-CM

## 2023-02-04 ENCOUNTER — Other Ambulatory Visit: Payer: BLUE CROSS/BLUE SHIELD

## 2023-02-25 ENCOUNTER — Ambulatory Visit (AMBULATORY_SURGERY_CENTER): Payer: BC Managed Care – PPO

## 2023-02-25 ENCOUNTER — Encounter: Payer: No Typology Code available for payment source | Admitting: Internal Medicine

## 2023-02-25 VITALS — Ht 64.0 in | Wt 145.0 lb

## 2023-02-25 DIAGNOSIS — Z1211 Encounter for screening for malignant neoplasm of colon: Secondary | ICD-10-CM

## 2023-02-25 MED ORDER — NA SULFATE-K SULFATE-MG SULF 17.5-3.13-1.6 GM/177ML PO SOLN: 1.0000 | Freq: Once | ORAL | 0 refills | Status: AC

## 2023-02-25 NOTE — Progress Notes (Signed)

## 2023-03-16 ENCOUNTER — Encounter: Payer: Self-pay | Admitting: Internal Medicine

## 2023-03-17 ENCOUNTER — Encounter (HOSPITAL_COMMUNITY): Payer: Self-pay | Admitting: Internal Medicine

## 2023-03-17 NOTE — Progress Notes (Signed)
 Attempted to obtain medical history for pre op call via telephone, unable to reach at this time. HIPAA compliant voicemail message left requesting return call to pre surgical testing department.

## 2023-03-23 NOTE — Anesthesia Preprocedure Evaluation (Signed)
Anesthesia Evaluation  Patient identified by MRN, date of birth, ID band Patient awake    Reviewed: Allergy & Precautions, NPO status , Patient's Chart, lab work & pertinent test results  Airway Mallampati: II  TM Distance: >3 FB Neck ROM: Full    Dental  (+) Teeth Intact, Dental Advisory Given   Pulmonary asthma , former smoker   Pulmonary exam normal breath sounds clear to auscultation       Cardiovascular negative cardio ROS Normal cardiovascular exam Rhythm:Regular Rate:Normal     Neuro/Psych  Headaches  negative psych ROS   GI/Hepatic negative GI ROS, Neg liver ROS,,,  Endo/Other  negative endocrine ROS    Renal/GU negative Renal ROS  negative genitourinary   Musculoskeletal negative musculoskeletal ROS (+)    Abdominal   Peds  Hematology negative hematology ROS (+)   Anesthesia Other Findings   Reproductive/Obstetrics negative OB ROS                             Anesthesia Physical Anesthesia Plan  ASA: 2  Anesthesia Plan: MAC   Post-op Pain Management:    Induction:   PONV Risk Score and Plan: 2 and Propofol infusion and TIVA  Airway Management Planned: Natural Airway and Simple Face Mask  Additional Equipment: None  Intra-op Plan:   Post-operative Plan:   Informed Consent: I have reviewed the patients History and Physical, chart, labs and discussed the procedure including the risks, benefits and alternatives for the proposed anesthesia with the patient or authorized representative who has indicated his/her understanding and acceptance.       Plan Discussed with: CRNA  Anesthesia Plan Comments:        Anesthesia Quick Evaluation

## 2023-03-24 ENCOUNTER — Ambulatory Visit (HOSPITAL_COMMUNITY)
Admission: RE | Admit: 2023-03-24 | Discharge: 2023-03-24 | Disposition: A | Discharge status: Home / Self Care | Payer: BC Managed Care – PPO | Attending: Internal Medicine | Admitting: Internal Medicine

## 2023-03-24 ENCOUNTER — Encounter (HOSPITAL_COMMUNITY)
Admission: RE | Disposition: A | Discharge status: Home / Self Care | Payer: Self-pay | Source: Home / Self Care | Attending: Internal Medicine

## 2023-03-24 ENCOUNTER — Ambulatory Visit (HOSPITAL_COMMUNITY): Payer: Self-pay | Admitting: Anesthesiology

## 2023-03-24 ENCOUNTER — Encounter (HOSPITAL_COMMUNITY): Payer: Self-pay | Admitting: Internal Medicine

## 2023-03-24 ENCOUNTER — Other Ambulatory Visit: Payer: Self-pay

## 2023-03-24 DIAGNOSIS — Z87891 Personal history of nicotine dependence: Secondary | ICD-10-CM | POA: Insufficient documentation

## 2023-03-24 DIAGNOSIS — R519 Headache, unspecified: Secondary | ICD-10-CM | POA: Diagnosis not present

## 2023-03-24 DIAGNOSIS — J45909 Unspecified asthma, uncomplicated: Secondary | ICD-10-CM | POA: Insufficient documentation

## 2023-03-24 DIAGNOSIS — Z1211 Encounter for screening for malignant neoplasm of colon: Secondary | ICD-10-CM | POA: Diagnosis present

## 2023-03-24 HISTORY — PX: COLONOSCOPY WITH PROPOFOL: SHX5780

## 2023-03-24 SURGERY — COLONOSCOPY WITH PROPOFOL
Anesthesia: Monitor Anesthesia Care

## 2023-03-24 MED ORDER — SODIUM CHLORIDE 0.9 % IV SOLN: INTRAVENOUS | Status: DC

## 2023-03-24 MED ORDER — PROPOFOL 500 MG/50ML IV EMUL
INTRAVENOUS | Status: DC | PRN
  Administered 2023-03-24: 80 ug/kg/min via INTRAVENOUS

## 2023-03-24 MED ORDER — PROPOFOL 10 MG/ML IV BOLUS
INTRAVENOUS | Status: DC | PRN
  Administered 2023-03-24: 40 mg via INTRAVENOUS
  Administered 2023-03-24: 60 mg via INTRAVENOUS

## 2023-03-24 MED ORDER — PROPOFOL 1000 MG/100ML IV EMUL
INTRAVENOUS | Status: AC
  Filled 2023-03-24: qty 100

## 2023-03-24 MED ORDER — SODIUM CHLORIDE 0.9 % IV SOLN: INTRAVENOUS | Status: DC | PRN

## 2023-03-24 MED ORDER — LIDOCAINE HCL (CARDIAC) PF 100 MG/5ML IV SOSY
PREFILLED_SYRINGE | INTRAVENOUS | Status: DC | PRN
  Administered 2023-03-24: 60 mg via INTRATRACHEAL

## 2023-03-24 SURGICAL SUPPLY — 20 items
ELECT REM PT RETURN 9FT ADLT (ELECTROSURGICAL)
ELECTRODE REM PT RTRN 9FT ADLT (ELECTROSURGICAL) IMPLANT
FLOOR PAD 36X40 (MISCELLANEOUS) ×1
FORCEPS BIOP RAD 4 LRG CAP 4 (CUTTING FORCEPS) IMPLANT
FORCEPS BIOP RJ4 240 W/NDL (CUTTING FORCEPS)
FORCEPS BXJMBJMB 240X2.8X (CUTTING FORCEPS) IMPLANT
INJECTOR/SNARE I SNARE (MISCELLANEOUS) IMPLANT
LUBRICANT JELLY 4.5OZ STERILE (MISCELLANEOUS) IMPLANT
MANIFOLD NEPTUNE II (INSTRUMENTS) IMPLANT
NDL SCLEROTHERAPY 25GX240 (NEEDLE) IMPLANT
NEEDLE SCLEROTHERAPY 25GX240 (NEEDLE)
PAD FLOOR 36X40 (MISCELLANEOUS) ×1 IMPLANT
PROBE APC STR FIRE (PROBE) IMPLANT
PROBE INJECTION GOLD 7FR (MISCELLANEOUS) IMPLANT
SNARE ROTATE MED OVAL 20MM (MISCELLANEOUS) IMPLANT
SYR 50ML LL SCALE MARK (SYRINGE) IMPLANT
TRAP SPECIMEN MUCOUS 40CC (MISCELLANEOUS) IMPLANT
TUBING ENDO SMARTCAP PENTAX (MISCELLANEOUS) IMPLANT
TUBING IRRIGATION ENDOGATOR (MISCELLANEOUS) ×1 IMPLANT
WATER STERILE IRR 1000ML POUR (IV SOLUTION) IMPLANT

## 2023-03-24 NOTE — Transfer of Care (Signed)
Immediate Anesthesia Transfer of Care Note  Patient: Erica Richards  Procedure(s) Performed: COLONOSCOPY WITH PROPOFOL  Patient Location: PACU  Anesthesia Type:MAC  Level of Consciousness: drowsy  Airway & Oxygen Therapy: Patient Spontanous Breathing and Patient connected to face mask oxygen  Post-op Assessment: Report given to RN and Post -op Vital signs reviewed and stable  Post vital signs: Reviewed and stable  Last Vitals:  Vitals Value Taken Time  BP 112/51 03/24/23 1008  Temp    Pulse 61 03/24/23 1010  Resp 18 03/24/23 1010  SpO2 100 % 03/24/23 1010  Vitals shown include unfiled device data.  Last Pain:  Vitals:   03/24/23 0901  TempSrc: Temporal  PainSc: 0-No pain         Complications: No notable events documented.

## 2023-03-24 NOTE — Discharge Instructions (Signed)

## 2023-03-24 NOTE — Anesthesia Postprocedure Evaluation (Signed)
Anesthesia Post Note  Patient: Erica Richards  Procedure(s) Performed: COLONOSCOPY WITH PROPOFOL     Patient location during evaluation: PACU Anesthesia Type: MAC Level of consciousness: awake and alert Pain management: pain level controlled Vital Signs Assessment: post-procedure vital signs reviewed and stable Respiratory status: spontaneous breathing, nonlabored ventilation and respiratory function stable Cardiovascular status: blood pressure returned to baseline and stable Postop Assessment: no apparent nausea or vomiting Anesthetic complications: no   No notable events documented.  Last Vitals:  Vitals:   03/24/23 1020 03/24/23 1030  BP: (!) 113/52 (!) 120/57  Pulse: (!) 58 60  Resp: 14 13  Temp:    SpO2: 100% 100%    Last Pain:  Vitals:   03/24/23 1030  TempSrc:   PainSc: 0-No pain                 [name]

## 2023-03-24 NOTE — Op Note (Signed)
[facility] Patient Name: Erica Richards Procedure Date: 03/24/2023 MRN: [id number] Attending MD: Wilhemina Bonito. Marina Goodell , MD, [id number] Date of Birth: 1965/02/27 CSN: [id number] Age: 58 Admit Type: Outpatient Procedure:                Colonoscopy Indications:              Screening for colorectal malignant neoplasm Providers:                Wilhemina Bonito. Marina Goodell, MD, Jacquelyn "Jaci" Clelia Croft, RN,                            Kandice Robinsons, Technician Referring MD:             Mitchel Honour, DO Medicines:                Monitored Anesthesia Care Complications:            No immediate complications. Estimated blood loss:                            None. Estimated Blood Loss:     Estimated blood loss: none. Procedure:                Pre-Anesthesia Assessment:                           - Prior to the procedure, a History and Physical                            was performed, and patient medications and                            allergies were reviewed. The patient's tolerance of                            previous anesthesia was also reviewed. The risks                            and benefits of the procedure and the sedation                            options and risks were discussed with the patient.                            All questions were answered, and informed consent                            was obtained. Prior Anticoagulants: The patient has                            taken no anticoagulant or antiplatelet agents.                            After reviewing the risks and benefits, the patient  was deemed in satisfactory condition to undergo the                            procedure.                           After obtaining informed consent, the colonoscope                            was passed under direct vision. Throughout the                            procedure, the patient's blood pressure, pulse, and                            oxygen saturations  were monitored continuously. The                            CF-HQ190L ([id number]) Olympus colonoscope was                            introduced through the anus and advanced to the the                            cecum, identified by appendiceal orifice and                            ileocecal valve. The ileocecal valve, appendiceal                            orifice, and rectum were photographed. The quality                            of the bowel preparation was excellent. The                            colonoscopy was performed with technical difficulty                            secondary to redundant and tortuous colon. The                            patient tolerated the procedure well. The bowel                            preparation used was SUPREP via split dose                            instruction. Scope In: 9:36:44 AM Scope Out: 10:03:54 AM Scope Withdrawal Time: 0 hours 6 minutes 24 seconds  Total Procedure Duration: 0 hours 27 minutes 10 seconds  Findings:      The entire examined colon appeared normal on direct and retroflexion       views. Impression:               -  The entire examined colon is normal on direct and                            retroflexion views.                           - No specimens collected. Moderate Sedation:      none Recommendation:           - Repeat colonoscopy in 10 years for screening                            purposes.                           - Patient has a contact number available for                            emergencies. The signs and symptoms of potential                            delayed complications were discussed with the                            patient. Return to normal activities tomorrow.                            Written discharge instructions were provided to the                            patient.                           - Resume previous diet.                           - Continue present medications. Procedure  Code(s):        --- Professional ---                           [phone], Colonoscopy, flexible; diagnostic, including                            collection of specimen(s) by brushing or washing,                            when performed (separate procedure) Diagnosis Code(s):        --- Professional ---                           Z12.11, Encounter for screening for malignant                            neoplasm of colon CPT copyright 2022 American Medical Association. All rights reserved. The codes documented in this report are preliminary and upon coder review may  be revised to meet current compliance requirements. Wilhemina Bonito. Marina Goodell, MD 03/24/2023 10:14:06 AM This  report has been signed electronically. Number of Addenda: 0

## 2023-03-24 NOTE — H&P (Signed)
HISTORY OF PRESENT ILLNESS:  Erica Richards is a 58 y.o. female who presents today for routine screening colonoscopy.  No complaints  REVIEW OF SYSTEMS:  All non-GI ROS negative except for  Past Medical History:  Diagnosis Date   Allergy    Asthma    Fibroids    Headache(784.0)     Past Surgical History:  Procedure Laterality Date   UTERINE FIBROID SURGERY      Social History Erica Richards  reports that she quit smoking about 28 years ago. Her smoking use included cigarettes. She has never used smokeless tobacco. She reports that she does not drink alcohol and does not use drugs.  family history includes Healthy in her mother.  Allergies  Allergen Reactions   Augmentin [Amoxicillin-Pot Clavulanate] Diarrhea and Nausea Only   Sulfa Antibiotics Rash       PHYSICAL EXAMINATION: Vital signs: Wt 65.8 kg   LMP 07/03/2015   BMI 24.90 kg/m  General: Well-developed, well-nourished, no acute distress HEENT: Sclerae are anicteric, conjunctiva pink. Oral mucosa intact Lungs: Clear Heart: Regular Abdomen: soft, nontender, nondistended, no obvious ascites, no peritoneal signs, normal bowel sounds. No organomegaly. Extremities: No edema Psychiatric: alert and oriented x3. Cooperative     ASSESSMENT:  Colon cancer screening   PLAN:   Screening colonoscopy

## 2023-03-26 ENCOUNTER — Encounter (HOSPITAL_COMMUNITY): Payer: Self-pay | Admitting: Internal Medicine
